# Patient Record
Sex: Female | Born: 1975 | Race: Black or African American | Hispanic: No | Marital: Single | Smoking: Former smoker
Health system: Southern US, Community
[De-identification: ages and names within clinical notes are randomized; demographics above are authoritative.]

## PROBLEM LIST (undated history)

## (undated) DIAGNOSIS — K631 Perforation of intestine (nontraumatic): Secondary | ICD-10-CM

## (undated) DIAGNOSIS — D250 Submucous leiomyoma of uterus: Secondary | ICD-10-CM

## (undated) DIAGNOSIS — N921 Excessive and frequent menstruation with irregular cycle: Secondary | ICD-10-CM

## (undated) DIAGNOSIS — E669 Obesity, unspecified: Secondary | ICD-10-CM

## (undated) DIAGNOSIS — N939 Abnormal uterine and vaginal bleeding, unspecified: Secondary | ICD-10-CM

## (undated) DIAGNOSIS — K5720 Diverticulitis of large intestine with perforation and abscess without bleeding: Secondary | ICD-10-CM

## (undated) DIAGNOSIS — D251 Intramural leiomyoma of uterus: Secondary | ICD-10-CM

## (undated) DIAGNOSIS — N971 Female infertility of tubal origin: Secondary | ICD-10-CM

## (undated) DIAGNOSIS — D649 Anemia, unspecified: Secondary | ICD-10-CM

## (undated) DIAGNOSIS — D252 Subserosal leiomyoma of uterus: Secondary | ICD-10-CM

## (undated) DIAGNOSIS — N978 Female infertility of other origin: Secondary | ICD-10-CM

## (undated) HISTORY — DX: Abnormal uterine and vaginal bleeding, unspecified: N93.9

## (undated) HISTORY — DX: Perforation of intestine (nontraumatic): K63.1

## (undated) HISTORY — DX: Submucous leiomyoma of uterus: D25.0

## (undated) HISTORY — DX: Subserosal leiomyoma of uterus: D25.2

## (undated) HISTORY — DX: Female infertility of tubal origin: N97.1

## (undated) HISTORY — DX: Female infertility of other origin: N97.8

## (undated) HISTORY — PX: UTERINE FIBROID SURGERY: SHX826

## (undated) HISTORY — DX: Intramural leiomyoma of uterus: D25.1

## (undated) HISTORY — DX: Diverticulitis of large intestine with perforation and abscess without bleeding: K57.20

## (undated) HISTORY — DX: Excessive and frequent menstruation with irregular cycle: N92.1

---

## 2012-02-10 ENCOUNTER — Emergency Department: Payer: Self-pay | Admitting: Emergency Medicine

## 2012-02-10 LAB — CBC
HCT: 34.6 % — ABNORMAL LOW (ref 35.0–47.0)
HGB: 11.5 g/dL — ABNORMAL LOW (ref 12.0–16.0)
MCH: 29.4 pg (ref 26.0–34.0)
MCV: 89 fL (ref 80–100)
RBC: 3.91 10*6/uL (ref 3.80–5.20)

## 2012-02-10 LAB — COMPREHENSIVE METABOLIC PANEL
Anion Gap: 5 — ABNORMAL LOW (ref 7–16)
BUN: 7 mg/dL (ref 7–18)
Bilirubin,Total: 0.3 mg/dL (ref 0.2–1.0)
Chloride: 105 mmol/L (ref 98–107)
Co2: 28 mmol/L (ref 21–32)
Creatinine: 0.8 mg/dL (ref 0.60–1.30)
EGFR (African American): >60
Potassium: 4 mmol/L (ref 3.5–5.1)
SGOT(AST): 13 U/L — ABNORMAL LOW (ref 15–37)
SGPT (ALT): 13 U/L (ref 12–78)
Sodium: 138 mmol/L (ref 136–145)
Total Protein: 9 g/dL — ABNORMAL HIGH (ref 6.4–8.2)

## 2012-02-10 LAB — LIPASE, BLOOD: Lipase: 90 U/L (ref 73–393)

## 2012-02-10 LAB — URINALYSIS, COMPLETE
Bilirubin,UR: NEGATIVE
Ketone: NEGATIVE
Ph: 5 (ref 4.5–8.0)
Protein: 30
Specific Gravity: 1.021 (ref 1.003–1.030)
Squamous Epithelial: 3
WBC UR: 370 /HPF (ref 0–5)

## 2012-02-10 LAB — WET PREP, GENITAL

## 2013-05-11 ENCOUNTER — Ambulatory Visit: Payer: Self-pay | Admitting: Obstetrics and Gynecology

## 2013-05-11 LAB — COMPREHENSIVE METABOLIC PANEL
Anion Gap: 7 (ref 7–16)
BUN: 10 mg/dL (ref 7–18)
Bilirubin,Total: 0.3 mg/dL (ref 0.2–1.0)
Calcium, Total: 8.7 mg/dL (ref 8.5–10.1)
Chloride: 107 mmol/L (ref 98–107)
Creatinine: 0.68 mg/dL (ref 0.60–1.30)
EGFR (African American): >60
Osmolality: 272 (ref 275–301)
Potassium: 4.1 mmol/L (ref 3.5–5.1)
SGOT(AST): 7 U/L — ABNORMAL LOW (ref 15–37)
SGPT (ALT): 14 U/L (ref 12–78)
Sodium: 137 mmol/L (ref 136–145)
Total Protein: 7.6 g/dL (ref 6.4–8.2)

## 2013-05-11 LAB — CBC
HCT: 21.7 % — ABNORMAL LOW (ref 35.0–47.0)
HGB: 6.9 g/dL — ABNORMAL LOW (ref 12.0–16.0)
MCV: 73 fL — ABNORMAL LOW (ref 80–100)
RDW: 19 % — ABNORMAL HIGH (ref 11.5–14.5)
WBC: 4.8 10*3/uL (ref 3.6–11.0)

## 2013-05-11 LAB — PREGNANCY, URINE: Pregnancy Test, Urine: NEGATIVE m[IU]/mL

## 2013-05-20 ENCOUNTER — Ambulatory Visit: Payer: Self-pay | Admitting: Obstetrics and Gynecology

## 2013-05-20 LAB — CBC
HCT: 22.3 % — ABNORMAL LOW (ref 35.0–47.0)
HGB: 6.8 g/dL — ABNORMAL LOW (ref 12.0–16.0)
MCH: 21.7 pg — ABNORMAL LOW (ref 26.0–34.0)
MCHC: 30.5 g/dL — ABNORMAL LOW (ref 32.0–36.0)
MCV: 71 fL — ABNORMAL LOW (ref 80–100)
Platelet: 304 10*3/uL (ref 150–440)

## 2013-05-23 LAB — PATHOLOGY REPORT

## 2014-04-11 ENCOUNTER — Encounter: Payer: Self-pay | Admitting: Obstetrics & Gynecology

## 2014-04-18 DIAGNOSIS — N921 Excessive and frequent menstruation with irregular cycle: Secondary | ICD-10-CM | POA: Insufficient documentation

## 2014-04-18 DIAGNOSIS — N971 Female infertility of tubal origin: Secondary | ICD-10-CM

## 2014-04-18 DIAGNOSIS — N978 Female infertility of other origin: Secondary | ICD-10-CM

## 2014-04-18 DIAGNOSIS — D250 Submucous leiomyoma of uterus: Secondary | ICD-10-CM

## 2014-04-18 HISTORY — DX: Excessive and frequent menstruation with irregular cycle: N92.1

## 2014-04-18 HISTORY — DX: Female infertility of tubal origin: N97.1

## 2014-04-18 HISTORY — DX: Female infertility of other origin: N97.8

## 2014-04-18 HISTORY — DX: Submucous leiomyoma of uterus: D25.0

## 2014-07-16 ENCOUNTER — Emergency Department: Payer: Self-pay | Admitting: Physician Assistant

## 2014-10-06 NOTE — Op Note (Signed)
PATIENT NAME:  Dana West, Dana West MR#:  [id number] DATE OF BIRTH:  [birth year]  DATE OF PROCEDURE:  05/20/2013  PREOPERATIVE DIAGNOSES: 1.  Abnormal uterine bleeding.  2.  Fibroid uterus (1 fibroid is submucosal, second fibroid is partially intramural and partially intracavitary).  3.  Chronic blood loss anemia.   POSTOPERATIVE DIAGNOSES: 1.  Abnormal uterine bleeding.  2.  Fibroid uterus (1 fibroid is submucosal, second fibroid is partially intramural and partially intracavitary).  3.  Chronic blood loss anemia.   PROCEDURES:  1.  Dilation and curettage.  2.  Hysteroscopy.  3.  Myomectomy using Mysure   ANESTHESIA:  General.  SURGEON:  Prentice Docker, M.D.   ESTIMATED BLOOD LOSS: 50 mL   OPERATIVE FLUIDS: 1000 mL crystalloid. Fluid deficit is 750 mL.   COMPLICATIONS: None.   FINDINGS: 1.  A 3 cm pedunculated fibroid emanating from left posterior lower uterine segment.  2.  Anterior fundal intramural and intracavitary fibroid impinging on the uterine cavity.   SPECIMENS: Fibroid fragments and endometrial curettings.   CONDITION: Stable at the end of the procedure.   PROCEDURE IN DETAIL: The patient was taken to the operating room where general anesthesia was administered and found to be adequate. She was placed in the dorsal supine high lithotomy position and prepped and draped in the usual sterile fashion. After timeout was called, her bladder was drained using a red rubber catheter. A sterile speculum was placed in the vagina and a single-tooth tenaculum was used to grasp the anterior lip of the cervix. The uterus was sounded to a depth of 11 cm. The hysteroscope was gently introduced through the cervix with the above-noted findings appreciated. The MyoSure device was used according to the manufacturer's recommendations to completely remove the pedunculated fibroid that was located at the lower uterine segment left posterior wall. The partially intracavitary fibroid was removed in as  much as it impinged on the cavity of the uterus. The procedure was terminated at this point and hemostasis was noted after lowering the pressure of the inflow to well below the MAP pressure of the patient. At this point the hysteroscope was removed.  The single-tooth tenaculum was removed from the cervix and silver nitrate was used to gain hemostasis. No instrumentation or sponges were remaining in the vagina to this point in the procedure.   The patient tolerated the procedure well. Sponge, lap, and needle counts were correct x 2. She was wearing pneumatic compression stockings that were on and operating throughout the entire procedure.   ____________________________ Will Bonnet, MD sdj:dp D: 05/20/2013 12:42:07 ET T: 05/20/2013 12:51:33 ET JOB#: [id number]  cc: Will Bonnet, MD, <Dictator> Will Bonnet MD ELECTRONICALLY SIGNED 05/26/2013 12:31

## 2015-01-11 DIAGNOSIS — D251 Intramural leiomyoma of uterus: Secondary | ICD-10-CM | POA: Insufficient documentation

## 2015-01-11 DIAGNOSIS — N971 Female infertility of tubal origin: Secondary | ICD-10-CM

## 2015-01-11 HISTORY — DX: Female infertility of tubal origin: N97.1

## 2015-01-11 HISTORY — DX: Intramural leiomyoma of uterus: D25.1

## 2015-01-12 DIAGNOSIS — N939 Abnormal uterine and vaginal bleeding, unspecified: Secondary | ICD-10-CM

## 2015-01-12 HISTORY — DX: Abnormal uterine and vaginal bleeding, unspecified: N93.9

## 2015-01-29 ENCOUNTER — Emergency Department
Admission: EM | Admit: 2015-01-29 | Discharge: 2015-01-29 | Disposition: A | Discharge status: Home / Self Care | Payer: BLUE CROSS/BLUE SHIELD | Attending: Emergency Medicine | Admitting: Emergency Medicine

## 2015-01-29 ENCOUNTER — Encounter: Payer: Self-pay | Admitting: Emergency Medicine

## 2015-01-29 DIAGNOSIS — H9202 Otalgia, left ear: Secondary | ICD-10-CM | POA: Diagnosis present

## 2015-01-29 DIAGNOSIS — Z720 Tobacco use: Secondary | ICD-10-CM | POA: Diagnosis not present

## 2015-01-29 DIAGNOSIS — H65192 Other acute nonsuppurative otitis media, left ear: Secondary | ICD-10-CM | POA: Diagnosis not present

## 2015-01-29 MED ORDER — AMOXICILLIN 500 MG PO CAPS: 500.0000 mg | ORAL_CAPSULE | Freq: Three times a day (TID) | ORAL | Status: DC

## 2015-01-29 MED ORDER — FEXOFENADINE-PSEUDOEPHED ER 60-120 MG PO TB12: 1.0000 | ORAL_TABLET | Freq: Two times a day (BID) | ORAL | Status: DC

## 2015-01-29 MED ORDER — TRAMADOL HCL 50 MG PO TABS: 50.0000 mg | ORAL_TABLET | Freq: Four times a day (QID) | ORAL | Status: AC | PRN

## 2015-01-29 NOTE — ED Provider Notes (Signed)
[facility] Emergency Department Provider Note  ____________________________________________  Time seen: Approximately 4:16 PM  I have reviewed the triage vital signs and the nursing notes.   HISTORY  Chief Complaint Otalgia    HPI Dana West is a 39 y.o. female patient complaining of left ear pain for 6 days. Patient stated the pain has gotten increasingly worse she is now having fever and some nausea. Patient states the ear is throbbing. Patient relieve by taking over-the-counter anti-inflammatory medications. Patient  also used sweet oil and another over-the-counter ear drops.Patient is rating her pain as a 10 over 10.  History reviewed. No pertinent past medical history.  There are no active problems to display for this patient.   Past Surgical History  Procedure Laterality Date  . Uterine fibroid surgery      No current outpatient prescriptions on file.  Allergies Review of patient's allergies indicates no known allergies.  No family history on file.  Social History Social History  Substance Use Topics  . Smoking status: Current Every Day Smoker    Types: Cigarettes  . Smokeless tobacco: None  . Alcohol Use: No    Review of Systems Constitutional: No fever/chills Eyes: No visual changes. ENT: No sore throat. Left ear pain Cardiovascular: Denies chest pain. Respiratory: Denies shortness of breath. Gastrointestinal: No abdominal pain.  No nausea, no vomiting.  No diarrhea.  No constipation. Genitourinary: Negative for dysuria. Musculoskeletal: Negative for back pain. Skin: Negative for rash. Neurological: Negative for headaches, focal weakness or numbness. 10-point ROS otherwise negative.  ____________________________________________   PHYSICAL EXAM:  VITAL SIGNS: ED Triage Vitals  Enc Vitals Group     BP 01/29/15 1514 145/72 mmHg     Pulse Rate 01/29/15 1514 85     Resp 01/29/15 1514 19     Temp 01/29/15 1514 99.1  F (37.3 C)     Temp Source 01/29/15 1514 Oral     SpO2 01/29/15 1514 97 %     Weight 01/29/15 1514 210 lb (95.255 kg)     Height 01/29/15 1514 5\' 8"  (1.727 m)     Head Cir --      Peak Flow --      Pain Score 01/29/15 1514 10     Pain Loc --      Pain Edu? --      Excl. in [place]? --    Constitutional: Alert and oriented. Well appearing and in no acute distress. Eyes: Conjunctivae are normal. PERRL. EOMI. Head: Atraumatic. Nose: No congestion/rhinnorhea. ENT: Edematous left TM is also erythematous. Nol adenopathy Mouth/Throat: Mucous membranes are moist.  Oropharynx non-erythematous. Neck: No stridor.  No cervical spine tenderness to palpation. Hematological/Lymphatic/Immunilogical: No cervical lymphadenopathy. Cardiovascular: Normal rate, regular rhythm. Grossly normal heart sounds.  Good peripheral circulation. Respiratory: Normal respiratory effort.  No retractions. Lungs CTAB. Gastrointestinal: Soft and nontender. No distention. No abdominal bruits. No CVA tenderness. Musculoskeletal: No lower extremity tenderness nor edema.  No joint effusions. Neurologic:  Normal speech and language. No gross focal neurologic deficits are appreciated. No gait instability. Skin:  Skin is warm, dry and intact. No rash noted. Psychiatric: Mood and affect are normal. Speech and behavior are normal.  ____________________________________________   LABS (all labs ordered are listed, but only abnormal results are displayed)  Labs Reviewed - No data to display ____________________________________________  EKG  ____________________________________________  RADIOLOGY   ____________________________________________   PROCEDURES  Procedure(s) performed: None  Critical Care performed: No  ____________________________________________   INITIAL IMPRESSION /  ASSESSMENT AND PLAN / ED COURSE  Pertinent labs & imaging results that were available during my care of the patient were reviewed by  me and considered in my medical decision making (see chart for details).  Left otitis media. Patient given prescription for amoxicillin, Allegra-D, and tramadol. Patient advised follow ENT clinic if no improvement or worsening of her complaint in 3 days. ____________________________________________   FINAL CLINICAL IMPRESSION(S) / ED DIAGNOSES  Final diagnoses:  Acute nonsuppurative otitis media of left ear      Sable Feil, PA-C 01/29/15 [place], PA-C 01/29/15 1625  Lavonia Drafts, MD 01/30/15 [phone]

## 2015-01-29 NOTE — ED Notes (Addendum)
Pt reports left earache x6 days, reports fever and vomiting x1 last night from the pain. Pt reports ear is throbbing. Pt reports taking 3 aleve this morning.

## 2015-01-29 NOTE — Discharge Instructions (Signed)

## 2015-01-29 NOTE — ED Notes (Signed)
Assessment per PA 

## 2015-06-17 HISTORY — PX: ABDOMINAL HYSTERECTOMY: SHX81

## 2015-08-26 ENCOUNTER — Emergency Department
Admission: EM | Admit: 2015-08-26 | Discharge: 2015-08-26 | Disposition: A | Discharge status: Home / Self Care | Payer: 59 | Attending: Emergency Medicine | Admitting: Emergency Medicine

## 2015-08-26 ENCOUNTER — Encounter: Payer: Self-pay | Admitting: Emergency Medicine

## 2015-08-26 DIAGNOSIS — M25512 Pain in left shoulder: Secondary | ICD-10-CM | POA: Diagnosis present

## 2015-08-26 DIAGNOSIS — M79622 Pain in left upper arm: Secondary | ICD-10-CM | POA: Insufficient documentation

## 2015-08-26 DIAGNOSIS — Z87891 Personal history of nicotine dependence: Secondary | ICD-10-CM | POA: Diagnosis not present

## 2015-08-26 DIAGNOSIS — Z792 Long term (current) use of antibiotics: Secondary | ICD-10-CM | POA: Insufficient documentation

## 2015-08-26 DIAGNOSIS — Z79899 Other long term (current) drug therapy: Secondary | ICD-10-CM | POA: Diagnosis not present

## 2015-08-26 DIAGNOSIS — M542 Cervicalgia: Secondary | ICD-10-CM | POA: Insufficient documentation

## 2015-08-26 MED ORDER — CYCLOBENZAPRINE HCL 10 MG PO TABS
5.0000 mg | ORAL_TABLET | Freq: Once | ORAL | Status: AC
  Administered 2015-08-26: 5 mg via ORAL
  Filled 2015-08-26: qty 1

## 2015-08-26 MED ORDER — CYCLOBENZAPRINE HCL 5 MG PO TABS: 5.0000 mg | ORAL_TABLET | Freq: Three times a day (TID) | ORAL | Status: DC | PRN

## 2015-08-26 NOTE — ED Notes (Signed)
Pt c/o pain in her neck radiating across bilateral shoulders and down both arms since Friday; pt says she does repetitive work at her job at [employer] and thinks this could be the cause; denies injury; pt able to replicate motion in triage without difficulty

## 2015-08-26 NOTE — ED Provider Notes (Signed)
[facility] Emergency Department Provider Note    ____________________________________________  Time seen: ~0335  I have reviewed the triage vital signs and the nursing notes.   HISTORY  Chief Complaint Neck Pain; Arm Pain; and Shoulder Pain   History limited by: Not Limited   HPI Dana West is a 40 y.o. female who presents to the emergency department todaybecause of concerns for musculoskeletal in. Patient states that she started having left shoulder, neck and arm pain. This started 2 days ago. She describes it is worse when she moves her arm over her head. It will become severe. She is additionally now having pain in the right upper extremity. Patient states that she thinks this might be due to repetitive motions at her job. She denies any trauma or injury to that extremity. Denies any fevers, nausea or vomiting.    History reviewed. No pertinent past medical history.  There are no active problems to display for this patient.   Past Surgical History  Procedure Laterality Date  . Uterine fibroid surgery    . Abdominal hysterectomy      Current Outpatient Rx  Name  Route  Sig  Dispense  Refill  . ibuprofen (ADVIL,MOTRIN) 600 MG tablet   Oral   Take 600 mg by mouth every 6 (six) hours as needed.         Marland Kitchen amoxicillin (AMOXIL) 500 MG capsule   Oral   Take 1 capsule (500 mg total) by mouth 3 (three) times daily.   30 capsule   0   . fexofenadine-pseudoephedrine (ALLEGRA-D) 60-120 MG per tablet   Oral   Take 1 tablet by mouth 2 (two) times daily.   30 tablet   0   . traMADol (ULTRAM) 50 MG tablet   Oral   Take 1 tablet (50 mg total) by mouth every 6 (six) hours as needed.   20 tablet   0     Allergies Valium  History reviewed. No pertinent family history.  Social History Social History  Substance Use Topics  . Smoking status: Former Research scientist (life sciences)  . Smokeless tobacco: None  . Alcohol Use: Yes     Comment: seldom     Review of Systems  Constitutional: Negative for fever. Cardiovascular: Negative for chest pain. Respiratory: Negative for shortness of breath. Gastrointestinal: Negative for abdominal pain, vomiting and diarrhea. Neurological: Negative for headaches, focal weakness or numbness.  10-point ROS otherwise negative.  ____________________________________________   PHYSICAL EXAM:  VITAL SIGNS: ED Triage Vitals  Enc Vitals Group     BP 08/26/15 0309 139/84 mmHg     Pulse Rate 08/26/15 0309 91     Resp 08/26/15 0309 18     Temp 08/26/15 0309 99.6 F (37.6 C)     Temp Source 08/26/15 0309 Oral     SpO2 08/26/15 0309 100 %     Weight 08/26/15 0309 225 lb (102.059 kg)     Height 08/26/15 0309 5\' 7"  (1.702 m)     Head Cir --      Peak Flow --      Pain Score 08/26/15 0310 10   Constitutional: Alert and oriented. Appears slightly anxious. Eyes: Conjunctivae are normal. PERRL. Normal extraocular movements. ENT   Head: Normocephalic and atraumatic.   Nose: No congestion/rhinnorhea.   Mouth/Throat: Mucous membranes are moist.   Neck: No stridor. Hematological/Lymphatic/Immunilogical: No cervical lymphadenopathy. Cardiovascular: Normal rate, regular rhythm.  No murmurs, rubs, or gallops. Respiratory: Normal respiratory effort without tachypnea nor retractions.  Breath sounds are clear and equal bilaterally. No wheezes/rales/rhonchi. Gastrointestinal: Soft and nontender. No distention.  Genitourinary: Deferred Musculoskeletal: Patient with some discomfort with active and passive range of motion of the left shoulder. Slightly tender over palpation of the trapezius deltoid and upper bicep. No obvious deformities. Neurovascularly intact distally. No erythema or warmth at the joint. Neurologic:  Normal speech and language. No gross focal neurologic deficits are appreciated.  Skin:  Skin is warm, dry and intact. No rash noted. Psychiatric: Mood and affect are normal. Speech and  behavior are normal. Patient exhibits appropriate insight and judgment.  ____________________________________________    LABS (pertinent positives/negatives)  None  ____________________________________________   EKG  None  ____________________________________________    RADIOLOGY  None  ____________________________________________   PROCEDURES  Procedure(s) performed: None  Critical Care performed: No  ____________________________________________   INITIAL IMPRESSION / ASSESSMENT AND PLAN / ED COURSE  Pertinent labs & imaging results that were available during my care of the patient were reviewed by me and considered in my medical decision making (see chart for details).  Patient presented to the emergency department today with concerns for upper extremity pain. On exam the pain is reproducible. Patient without any concerning signs for septic joint or deformity. Think likely patient suffering pain secondary to overuse. Will give muscle relaxant.   ____________________________________________   FINAL CLINICAL IMPRESSION(S) / ED DIAGNOSES  Final diagnoses:  Left shoulder pain     Nance Pear, MD 08/26/15 [phone]

## 2015-08-26 NOTE — Discharge Instructions (Signed)
Please seek medical attention for any high fevers, chest pain, shortness of breath, change in behavior, persistent vomiting, bloody stool or any other new or concerning symptoms. ° ° °Musculoskeletal Pain °Musculoskeletal pain is muscle and boney aches and pains. These pains can occur in any part of the body. Your caregiver may treat you without knowing the cause of the pain. They may treat you if blood or urine tests, X-rays, and other tests were normal.  °CAUSES °There is often not a definite cause or reason for these pains. These pains may be caused by a type of germ (virus). The discomfort may also come from overuse. Overuse includes working out too hard when your body is not fit. Boney aches also come from weather changes. Bone is sensitive to atmospheric pressure changes. °HOME CARE INSTRUCTIONS  °· Ask when your test results will be ready. Make sure you get your test results. °· Only take over-the-counter or prescription medicines for pain, discomfort, or fever as directed by your caregiver. If you were given medications for your condition, do not drive, operate machinery or power tools, or sign legal documents for 24 hours. Do not drink alcohol. Do not take sleeping pills or other medications that may interfere with treatment. °· Continue all activities unless the activities cause more pain. When the pain lessens, slowly resume normal activities. Gradually increase the intensity and duration of the activities or exercise. °· During periods of severe pain, bed rest may be helpful. Lay or sit in any position that is comfortable. °· Putting ice on the injured area. °¨ Put ice in a bag. °¨ Place a towel between your skin and the bag. °¨ Leave the ice on for 15 to 20 minutes, 3 to 4 times a day. °· Follow up with your caregiver for continued problems and no reason can be found for the pain. If the pain becomes worse or does not go away, it may be necessary to repeat tests or do additional testing. Your caregiver  may need to look further for a possible cause. °SEEK IMMEDIATE MEDICAL CARE IF: °· You have pain that is getting worse and is not relieved by medications. °· You develop chest pain that is associated with shortness or breath, sweating, feeling sick to your stomach (nauseous), or throw up (vomit). °· Your pain becomes localized to the abdomen. °· You develop any new symptoms that seem different or that concern you. °MAKE SURE YOU:  °· Understand these instructions. °· Will watch your condition. °· Will get help right away if you are not doing well or get worse. °  °This information is not intended to replace advice given to you by your health care provider. Make sure you discuss any questions you have with your health care provider. °  °Document Released: 06/02/2005 Document Revised: 08/25/2011 Document Reviewed: 02/04/2013 °Elsevier Interactive Patient Education ©2016 [employer] ° °

## 2015-11-08 ENCOUNTER — Emergency Department
Admission: EM | Admit: 2015-11-08 | Discharge: 2015-11-08 | Disposition: A | Discharge status: Home / Self Care | Payer: 59 | Attending: Emergency Medicine | Admitting: Emergency Medicine

## 2015-11-08 ENCOUNTER — Encounter: Payer: Self-pay | Admitting: Emergency Medicine

## 2015-11-08 DIAGNOSIS — F17210 Nicotine dependence, cigarettes, uncomplicated: Secondary | ICD-10-CM | POA: Diagnosis not present

## 2015-11-08 DIAGNOSIS — K120 Recurrent oral aphthae: Secondary | ICD-10-CM | POA: Diagnosis not present

## 2015-11-08 DIAGNOSIS — K1379 Other lesions of oral mucosa: Secondary | ICD-10-CM | POA: Diagnosis present

## 2015-11-08 DIAGNOSIS — Z79899 Other long term (current) drug therapy: Secondary | ICD-10-CM | POA: Diagnosis not present

## 2015-11-08 DIAGNOSIS — Z792 Long term (current) use of antibiotics: Secondary | ICD-10-CM | POA: Diagnosis not present

## 2015-11-08 DIAGNOSIS — Z791 Long term (current) use of non-steroidal anti-inflammatories (NSAID): Secondary | ICD-10-CM | POA: Diagnosis not present

## 2015-11-08 MED ORDER — MAGIC MOUTHWASH W/LIDOCAINE: 5.0000 mL | Freq: Four times a day (QID) | ORAL | Status: DC

## 2015-11-08 NOTE — ED Notes (Signed)
Pt with multiple white painful areas roof of mouth.

## 2015-11-08 NOTE — ED Notes (Signed)
Pt with blisters/ulcers on roof of mouth and on the back of her tongue that are painful and itchy that appeared 1.5 weeks ago.  Sores are not improving.  No swelling of lips, tongue or face.  Pt in no distress at this time.

## 2015-11-08 NOTE — ED Provider Notes (Signed)
[facility] Emergency Department Provider Note  ____________________________________________  Time seen: Approximately 3:23 PM  I have reviewed the triage vital signs and the nursing notes.   HISTORY  Chief Complaint Mouth Lesions    HPI Dana West is a 40 y.o. female who presents to emergency department complaining of "ulcers" to the roof of her mouth 7-10 days. Patient states that they all presented at the same time and she indicates there are 3 lesions. Patient states they're both itchy as well as a burning sensation. She states that they have not changed in size or location. She denies any swelling of tongue or lips. Patient denies any difficulty swallowing or breathing. Patient denies any fevers or chills, sore throat, chest pain, shortness of breath, nausea or vomiting. Patient denies any other medical history. Patient denies history of same in the past. She denies any medication use at this time.   History reviewed. No pertinent past medical history.  There are no active problems to display for this patient.   Past Surgical History  Procedure Laterality Date  . Uterine fibroid surgery    . Abdominal hysterectomy      Current Outpatient Rx  Name  Route  Sig  Dispense  Refill  . amoxicillin (AMOXIL) 500 MG capsule   Oral   Take 1 capsule (500 mg total) by mouth 3 (three) times daily.   30 capsule   0   . cyclobenzaprine (FLEXERIL) 5 MG tablet   Oral   Take 1 tablet (5 mg total) by mouth every 8 (eight) hours as needed for muscle spasms.   20 tablet   0   . fexofenadine-pseudoephedrine (ALLEGRA-D) 60-120 MG per tablet   Oral   Take 1 tablet by mouth 2 (two) times daily.   30 tablet   0   . ibuprofen (ADVIL,MOTRIN) 600 MG tablet   Oral   Take 600 mg by mouth every 6 (six) hours as needed.         . magic mouthwash w/lidocaine SOLN   Oral   Take 5 mLs by mouth 4 (four) times daily.   240 mL   0     Dispense in a 1/1/1/1  ratio. Use lidocaine, diphen ...   . traMADol (ULTRAM) 50 MG tablet   Oral   Take 1 tablet (50 mg total) by mouth every 6 (six) hours as needed.   20 tablet   0     Allergies Raspberry and Valium  No family history on file.  Social History Social History  Substance Use Topics  . Smoking status: Current Some Day Smoker  . Smokeless tobacco: None  . Alcohol Use: Yes     Comment: seldom     Review of Systems  Constitutional: No fever/chills ENT: Patient reports mouth ulcerations to the roof of the mouth. Cardiovascular: no chest pain. Respiratory: no cough. No SOB. Gastrointestinal: No abdominal pain.  No nausea, no vomiting.   Musculoskeletal: Negative for musculoskeletal pain. Skin: Negative for rash, abrasions, lacerations, ecchymosis. Neurological: Negative for headaches, focal weakness or numbness. 10-point ROS otherwise negative.  ____________________________________________   PHYSICAL EXAM:  VITAL SIGNS: ED Triage Vitals  Enc Vitals Group     BP 11/08/15 1304 129/70 mmHg     Pulse Rate 11/08/15 1304 82     Resp 11/08/15 1304 16     Temp 11/08/15 1304 98.1 F (36.7 C)     Temp Source 11/08/15 1304 Oral     SpO2 11/08/15 1304  100 %     Weight 11/08/15 1304 200 lb (90.719 kg)     Height 11/08/15 1304 5\' 6"  (1.676 m)     Head Cir --      Peak Flow --      Pain Score 11/08/15 1311 8     Pain Loc --      Pain Edu? --      Excl. in [place]? --      Constitutional: Alert and oriented. Well appearing and in no acute distress. Eyes: Conjunctivae are normal. PERRL. EOMI. Head: Atraumatic. ENT:      Ears:       Nose: No congestion/rhinnorhea.      Mouth/Throat: Mucous membranes are moist. 5 well demarcated ulcerations are noted to the hard palate of the mouth. These are less than 2 mm in diameter. Patient does have poor dentition but there are no signs of erythema or edema suggesting abscess. No loose or missing dentition. Ulcerations are not visualized and the  pharyngeal region. Uvula is unremarkable and midline. Neck: No stridor.   Hematological/Lymphatic/Immunilogical: No cervical lymphadenopathy. Cardiovascular: Normal rate, regular rhythm. Normal S1 and S2.  Good peripheral circulation. Respiratory: Normal respiratory effort without tachypnea or retractions. Lungs CTAB. Good air entry to the bases with no decreased or absent breath sounds Musculoskeletal: Full range of motion to all extremities. No gross deformities appreciated. Neurologic:  Normal speech and language. No gross focal neurologic deficits are appreciated.  Skin:  Skin is warm, dry and intact. No rash noted. Psychiatric: Mood and affect are normal. Speech and behavior are normal. Patient exhibits appropriate insight and judgement.   ____________________________________________   LABS (all labs ordered are listed, but only abnormal results are displayed)  Labs Reviewed - No data to display ____________________________________________  EKG   ____________________________________________  RADIOLOGY   No results found.  ____________________________________________    PROCEDURES  Procedure(s) performed:       Medications - No data to display   ____________________________________________   INITIAL IMPRESSION / ASSESSMENT AND PLAN / ED COURSE  Pertinent labs & imaging results that were available during my care of the patient were reviewed by me and considered in my medical decision making (see chart for details).  Patient's diagnosis is consistent with aphthous stomatitis. Exam is reassuring with no indication of airway obstruction. No signs of systemic infection. Patient has 5 visible ulcerations to the hard palate consistent with aphthous stomatitis.. Patient will be discharged home with prescriptions for Magic mouthwash for symptom management. Patient is to follow up with dentist as needed or otherwise directed. Patient is given ED precautions to return to  the ED for any worsening or new symptoms.     ____________________________________________  FINAL CLINICAL IMPRESSION(S) / ED DIAGNOSES  Final diagnoses:  Aphthous stomatitis      NEW MEDICATIONS STARTED DURING THIS VISIT:  New Prescriptions   MAGIC MOUTHWASH W/LIDOCAINE SOLN    Take 5 mLs by mouth 4 (four) times daily.        This chart was dictated using voice recognition software/Dragon. Despite best efforts to proofread, errors can occur which can change the meaning. Any change was purely unintentional.    Darletta Moll, PA-C 11/08/15 1554

## 2015-11-08 NOTE — Discharge Instructions (Signed)
Oral Ulcers °Oral ulcers are painful, shallow sores around the lining of the mouth. They can affect the gums, the inside of the lips, and the cheeks. (Sores on the outside of the lips and on the face are different.) They typically first occur in school-aged children and teenagers. Oral ulcers may also be called canker sores or cold sores. °CAUSES  °Canker sores and cold sores can be caused by many factors including: °· Infection. °· Injury. °· Sun exposure. °· Medications. °· Emotional stress. °· Food allergies. °· Vitamin deficiencies. °· Toothpastes containing sodium lauryl sulfate. °The herpes virus can be the cause of mouth ulcers. The first infection can be severe and cause 10 or more ulcers on the gums, tongue, and lips with fever and difficulty in swallowing. This infection usually occurs between the ages of 1 and 3 years.  °SYMPTOMS  °The typical sore is about ¼ inch (6 mm) in size and is an oval or round ulcer with red borders. °DIAGNOSIS  °Your caregiver can diagnose simple oral ulcers by examination. Additional testing is usually not required.  °TREATMENT  °Treatment is aimed at pain relief. Generally, oral ulcers resolve by themselves within 1 to 2 weeks without medication and are not contagious unless caused by herpes (and other viruses). Antibiotics are not effective with mouth sores. Avoid direct contact with others until the ulcer is completely healed. See your caregiver for follow-up care as recommended. Also: °· Offer a soft diet. °· Encourage plenty of fluids to prevent dehydration. Popsicles and milk shakes can be helpful. °· Avoid acidic and salty foods and drinks such as orange juice. °· Infants and young children will often refuse to drink because of pain. Using a teaspoon, cup, or syringe to give small amounts of fluids frequently can help prevent dehydration. °· Cold compresses on the face may help reduce pain. °· Pain medication can help control soreness. °· A solution of diphenhydramine  mixed with a liquid antacid can be useful to decrease the soreness of ulcers. Consult a caregiver for the dosing. °· Liquids or ointments with a numbing ingredient may be helpful when used as recommended. °· Older children and teenagers can rinse their mouth with a salt-water mixture (1/2 teaspoon of salt in 8 ounces of water) four times a day. This treatment is uncomfortable but may reduce the time the ulcers are present. °· There are many over-the-counter throat lozenges and medications available for oral ulcers. Their effectiveness has not been studied. °· Consult your medical caregiver prior to using homeopathic treatments for oral ulcers. °SEEK MEDICAL CARE IF:  °· You think your child needs to be seen. °· The pain worsens and you cannot control it. °· There are 4 or more ulcers. °· The lips and gums begin to bleed and crust. °· A single mouth ulcer is near a tooth that is causing a toothache or pain. °· Your child has a fever, swollen face, or swollen glands. °· The ulcers began after starting a medication. °· Mouth ulcers keep reoccurring or last more than 2 weeks. °· You think your child is not taking adequate fluids. °SEEK IMMEDIATE MEDICAL CARE IF:  °· Your child has a high fever. °· Your child is unable to swallow or becomes dehydrated. °· Your child looks or acts very ill. °· An ulcer caused by a chemical your child accidentally put in their mouth. °  °This information is not intended to replace advice given to you by your health care provider. Make sure you discuss any   questions you have with your health care provider.   Document Released: 07/10/2004 Document Revised: 06/23/2014 Document Reviewed: 10/18/2014 Elsevier Interactive Patient Education 2016 [place] and Dentist Visits Dental care supports good overall health. Regular dental visits can also help you avoid dental pain, bleeding, infection, and other more serious health problems in the future. It is important to keep the  mouth healthy because diseases in the teeth, gums, and other oral tissues can spread to other areas of the body. Some problems, such as diabetes, heart disease, and pre-term labor have been associated with poor oral health.  See your dentist every 6 months. If you experience emergency problems such as a toothache or broken tooth, go to the dentist right away. If you see your dentist regularly, you may catch problems early. It is easier to be treated for problems in the early stages.  WHAT TO EXPECT AT A DENTIST VISIT  Your dentist will look for many common oral health problems and recommend proper treatment. At your regular dental visit, you can expect:  Gentle cleaning of the teeth and gums. This includes scraping and polishing. This helps to remove the sticky substance around the teeth and gums (plaque). Plaque forms in the mouth shortly after eating. Over time, plaque hardens on the teeth as tartar. If tartar is not removed regularly, it can cause problems. Cleaning also helps remove stains.  Periodic X-rays. These pictures of the teeth and supporting bone will help your dentist assess the health of your teeth.  Periodic fluoride treatments. Fluoride is a natural mineral shown to help strengthen teeth. Fluoride treatmentinvolves applying a fluoride gel or varnish to the teeth. It is most commonly done in children.  Examination of the mouth, tongue, jaws, teeth, and gums to look for any oral health problems, such as:  Cavities (dental caries). This is decay on the tooth caused by plaque, sugar, and acid in the mouth. It is best to catch a cavity when it is small.  Inflammation of the gums caused by plaque buildup (gingivitis).  Problems with the mouth or malformed or misaligned teeth.  Oral cancer or other diseases of the soft tissues or jaws. KEEP YOUR TEETH AND GUMS HEALTHY For healthy teeth and gums, follow these general guidelines as well as your dentist's specific advice:  Have your  teeth professionally cleaned at the dentist every 6 months.  Brush twice daily with a fluoride toothpaste.  Floss your teeth daily.  Ask your dentist if you need fluoride supplements, treatments, or fluoride toothpaste.  Eat a healthy diet. Reduce foods and drinks with added sugar.  Avoid smoking. TREATMENT FOR ORAL HEALTH PROBLEMS If you have oral health problems, treatment varies depending on the conditions present in your teeth and gums.  Your caregiver will most likely recommend good oral hygiene at each visit.  For cavities, gingivitis, or other oral health disease, your caregiver will perform a procedure to treat the problem. This is typically done at a separate appointment. Sometimes your caregiver will refer you to another dental specialist for specific tooth problems or for surgery. SEEK IMMEDIATE DENTAL CARE IF:  You have pain, bleeding, or soreness in the gum, tooth, jaw, or mouth area.  A permanent tooth becomes loose or separated from the gum socket.  You experience a blow or injury to the mouth or jaw area.   This information is not intended to replace advice given to you by your health care provider. Make sure you discuss any questions you  have with your health care provider.   Document Released: 02/12/2011 Document Revised: 08/25/2011 Document Reviewed: 02/12/2011 Elsevier Interactive Patient Education 2016 [place] [facility]  [place] [facility] and [place] [email or web address] [phone])  [facility] [phone])  [place] [phone])  [employer] [phone])  [facility] [phone]) This clinic caters to the indigent population and is on a lottery system. Location: [employer] [facility], [place], [place], [place] [facility] Hours: Wednesdays from 6pm - 9pm, patients seen by a lottery system. For dates, call or go to [email or web address] Services: Cleanings, fillings and simple extractions. Payment Options: DENTAL WORK IS FREE OF CHARGE. Bring proof of income or support. Best way to get seen: Arrive at 5:15 pm - this is a lottery, NOT first come/first serve, so arriving earlier will not increase your chances of being seen.     [facility] [phone] Select option 1 for emergencies   Location: [facility], [place], [address], [place] Clinic Hours: No walk-ins accepted - call the day before to schedule an appointment. Check in times are 9:30 am and 1:30 pm. Services: Simple extractions, temporary fillings, pulpectomy/pulp debridement, uncomplicated abscess drainage. Payment Options: PAYMENT IS DUE AT THE TIME OF SERVICE.  Fee is usually $100-200, additional surgical procedures (e.g. abscess drainage) may be extra. Cash, checks, Visa/MasterCard accepted.  Can file Medicaid if patient is covered for dental - patient should call case worker to check. No discount for [facility] patients. Best way to get seen: MUST call the day before and get onto the schedule. Can usually be seen the next 1-2 days. No walk-ins accepted.     [place] [phone]   Location: [place], [place] [facility] Hours: M, W, Th, F 8am or 1:30pm, Tues 9a or 1:30 - first come/first served. Services: Simple extractions, temporary fillings, uncomplicated abscess drainage.  You do not need to be an [facility] resident. Payment Options: PAYMENT IS DUE AT THE TIME OF SERVICE. Dental insurance, otherwise sliding scale - bring proof of income or support. Depending on income and treatment needed, cost is usually $50-200. Best way to get seen: Arrive early as it is first come/first  served.     [facility] [phone]   Location: [place] [facility] Hours: Mon-Thu 8a-5p Services: Most basic dental services including extractions and fillings. Payment Options: PAYMENT IS DUE AT THE TIME OF SERVICE. Sliding scale, up to 50% off - bring proof if income or support. Medicaid with dental option accepted. Best way to get seen: Call to schedule an appointment, can usually be seen within 2 weeks OR they will try to see walk-ins - show up at [place] or 2p (you may have to wait).     [facility] [place] RESIDENTS ONLY   Location: [facility], [place] [address] [place], [place] [address] Clinic Hours: By appointment only. Monday - Thursday 8am-5pm, Friday 8am-12pm Services: Cleanings, fillings, extractions. Payment Options: PAYMENT IS DUE AT THE TIME OF SERVICE. Cash, Visa or MasterCard. Sliding scale - $30 minimum per service. Best way to get seen: Come in to office, complete packet and make an appointment - need proof of income or support monies for each household member and proof  of [facility] residence. Usually takes about a month to get in.     [facility] [phone]   Location: [address], [place] Clinic Hours: Walk-in Urgent Care Dental Services are offered Monday-Friday mornings only. The numbers of emergencies accepted daily is limited to the number of providers available. Maximum 15 - Mondays, Wednesdays & Thursdays Maximum 10 - Tuesdays & Fridays Services: You do not need to be a [facility] resident to be seen for a dental emergency. Emergencies are defined as pain, swelling, abnormal bleeding, or dental trauma. Walkins will receive x-rays if needed. NOTE: Dental cleaning is not an emergency. Payment Options: PAYMENT IS DUE AT THE TIME OF SERVICE. Minimum co-pay is $40.00 for uninsured patients. Minimum  co-pay is $3.00 for Medicaid with dental coverage. Dental Insurance is accepted and must be presented at time of visit. Medicare does not cover dental. Forms of payment: Cash, credit card, checks. Best way to get seen: If not previously registered with the clinic, walk-in dental registration begins at 7:15 am and is on a first come/first serve basis. If previously registered with the clinic, call to make an appointment.     [facility] [place] ONLY   Location: [address], [place], [place] Clinic Hours: Mon-Thu 10a-2p Services: Extractions only! Payment Options: FREE (donations accepted) - bring proof of income or support Best way to get seen: Call and schedule an appointment OR come at 8am on the 1st Monday of every month (except for holidays) when it is first come/first served.     [facility] [phone]   Location: [place], [place] [facility] Hours: Friday mornings Services, Payment Options, Best way to get seen: Call for info

## 2017-02-23 ENCOUNTER — Emergency Department: Payer: 59

## 2017-02-23 ENCOUNTER — Emergency Department
Admission: EM | Admit: 2017-02-23 | Discharge: 2017-02-23 | Disposition: A | Discharge status: Home / Self Care | Payer: 59 | Attending: Emergency Medicine | Admitting: Emergency Medicine

## 2017-02-23 DIAGNOSIS — K5792 Diverticulitis of intestine, part unspecified, without perforation or abscess without bleeding: Secondary | ICD-10-CM

## 2017-02-23 DIAGNOSIS — Z79899 Other long term (current) drug therapy: Secondary | ICD-10-CM | POA: Insufficient documentation

## 2017-02-23 DIAGNOSIS — K5732 Diverticulitis of large intestine without perforation or abscess without bleeding: Secondary | ICD-10-CM | POA: Insufficient documentation

## 2017-02-23 DIAGNOSIS — F17210 Nicotine dependence, cigarettes, uncomplicated: Secondary | ICD-10-CM | POA: Diagnosis not present

## 2017-02-23 DIAGNOSIS — R1030 Lower abdominal pain, unspecified: Secondary | ICD-10-CM | POA: Diagnosis present

## 2017-02-23 LAB — COMPREHENSIVE METABOLIC PANEL
ALK PHOS: 81 U/L (ref 38–126)
ALT: 19 U/L (ref 14–54)
AST: 20 U/L (ref 15–41)
Albumin: 3.5 g/dL (ref 3.5–5.0)
Anion gap: 8 (ref 5–15)
BUN: 10 mg/dL (ref 6–20)
CALCIUM: 9.2 mg/dL (ref 8.9–10.3)
CHLORIDE: 101 mmol/L (ref 101–111)
CO2: 25 mmol/L (ref 22–32)
CREATININE: 0.79 mg/dL (ref 0.44–1.00)
GFR calc Af Amer: >60 mL/min (ref >60)
Glucose, Bld: 117 mg/dL — ABNORMAL HIGH (ref 65–99)
Potassium: 4 mmol/L (ref 3.5–5.1)
SODIUM: 134 mmol/L — AB (ref 135–145)
Total Bilirubin: 0.6 mg/dL (ref 0.3–1.2)
Total Protein: 8.4 g/dL — ABNORMAL HIGH (ref 6.5–8.1)

## 2017-02-23 LAB — URINALYSIS, COMPLETE (UACMP) WITH MICROSCOPIC
Bacteria, UA: NONE SEEN
Bilirubin Urine: NEGATIVE
Glucose, UA: NEGATIVE mg/dL
Ketones, ur: NEGATIVE mg/dL
Leukocytes, UA: NEGATIVE
Nitrite: NEGATIVE
PH: 5 (ref 5.0–8.0)
Protein, ur: NEGATIVE mg/dL
SPECIFIC GRAVITY, URINE: 1.021 (ref 1.005–1.030)

## 2017-02-23 LAB — CBC
HCT: 35.7 % (ref 35.0–47.0)
Hemoglobin: 12.2 g/dL (ref 12.0–16.0)
MCH: 30.5 pg (ref 26.0–34.0)
MCHC: 34 g/dL (ref 32.0–36.0)
MCV: 89.4 fL (ref 80.0–100.0)
PLATELETS: 293 10*3/uL (ref 150–440)
RBC: 3.99 MIL/uL (ref 3.80–5.20)
RDW: 13.7 % (ref 11.5–14.5)
WBC: 15.2 10*3/uL — ABNORMAL HIGH (ref 3.6–11.0)

## 2017-02-23 LAB — LIPASE, BLOOD: LIPASE: 24 U/L (ref 11–51)

## 2017-02-23 MED ORDER — CIPROFLOXACIN HCL 500 MG PO TABS: 500.0000 mg | ORAL_TABLET | Freq: Two times a day (BID) | ORAL | 0 refills | Status: AC

## 2017-02-23 MED ORDER — CIPROFLOXACIN HCL 500 MG PO TABS
500.0000 mg | ORAL_TABLET | Freq: Once | ORAL | Status: AC
  Administered 2017-02-23: 500 mg via ORAL
  Filled 2017-02-23: qty 1

## 2017-02-23 MED ORDER — SODIUM CHLORIDE 0.9 % IV BOLUS (SEPSIS)
1000.0000 mL | Freq: Once | INTRAVENOUS | Status: AC
  Administered 2017-02-23: 1000 mL via INTRAVENOUS

## 2017-02-23 MED ORDER — IOPAMIDOL (ISOVUE-300) INJECTION 61%
30.0000 mL | Freq: Once | INTRAVENOUS | Status: AC | PRN
  Administered 2017-02-23: 30 mL via ORAL

## 2017-02-23 MED ORDER — MORPHINE SULFATE (PF) 4 MG/ML IV SOLN
4.0000 mg | Freq: Once | INTRAVENOUS | Status: AC
  Administered 2017-02-23: 4 mg via INTRAVENOUS
  Filled 2017-02-23: qty 1

## 2017-02-23 MED ORDER — METRONIDAZOLE 500 MG PO TABS: 500.0000 mg | ORAL_TABLET | Freq: Three times a day (TID) | ORAL | 0 refills | Status: AC

## 2017-02-23 MED ORDER — OXYCODONE-ACETAMINOPHEN 5-325 MG PO TABS: 1.0000 | ORAL_TABLET | ORAL | 0 refills | Status: DC | PRN

## 2017-02-23 MED ORDER — IOPAMIDOL (ISOVUE-300) INJECTION 61%
100.0000 mL | Freq: Once | INTRAVENOUS | Status: AC | PRN
  Administered 2017-02-23: 100 mL via INTRAVENOUS

## 2017-02-23 MED ORDER — ONDANSETRON HCL 4 MG PO TABS: 4.0000 mg | ORAL_TABLET | Freq: Three times a day (TID) | ORAL | 0 refills | Status: DC | PRN

## 2017-02-23 MED ORDER — METRONIDAZOLE 500 MG PO TABS
500.0000 mg | ORAL_TABLET | Freq: Once | ORAL | Status: AC
  Administered 2017-02-23: 500 mg via ORAL
  Filled 2017-02-23: qty 1

## 2017-02-23 MED ORDER — KETOROLAC TROMETHAMINE 30 MG/ML IJ SOLN
30.0000 mg | Freq: Once | INTRAMUSCULAR | Status: AC
  Administered 2017-02-23: 30 mg via INTRAVENOUS
  Filled 2017-02-23: qty 1

## 2017-02-23 NOTE — ED Notes (Signed)
Patient transported to CT 

## 2017-02-23 NOTE — ED Triage Notes (Signed)
Lower abdominal cramping X 2 weeks. Worse after eating.

## 2017-02-23 NOTE — Discharge Instructions (Signed)
Please seek medical attention for any high fevers, chest pain, shortness of breath, change in behavior, persistent vomiting, bloody stool or any other new or concerning symptoms.  

## 2017-02-23 NOTE — ED Provider Notes (Signed)
[facility] Emergency Department Provider Note  ____________________________________________   I have reviewed the triage vital signs and the nursing notes.   HISTORY  Chief Complaint Abdominal Pain   History limited by: Not Limited   HPI Dana West is a 41 y.o. female who presents to the emergency department today because of concerns for abdominal pain. It is located in the suprapubic and left lower quadrant. It started 2 weeks ago. It is intermittent. It will be severe and sharp in nature. She has not noticed anything that she does that brings the pain on. Has been accompanied by nausea and vomiting. No blood in the vomit. No change in defecation or urination. Patient denies similar symptoms in the past.   History reviewed. No pertinent past medical history.  There are no active problems to display for this patient.   Past Surgical History:  Procedure Laterality Date  . ABDOMINAL HYSTERECTOMY    . UTERINE FIBROID SURGERY      Prior to Admission medications   Medication Sig Start Date End Date Taking? Authorizing Provider  amoxicillin (AMOXIL) 500 MG capsule Take 1 capsule (500 mg total) by mouth 3 (three) times daily. 01/29/15   Sable Feil, PA-C  cyclobenzaprine (FLEXERIL) 5 MG tablet Take 1 tablet (5 mg total) by mouth every 8 (eight) hours as needed for muscle spasms. 08/26/15   Nance Pear, MD  fexofenadine-pseudoephedrine (ALLEGRA-D) 60-120 MG per tablet Take 1 tablet by mouth 2 (two) times daily. 01/29/15   Sable Feil, PA-C  ibuprofen (ADVIL,MOTRIN) 600 MG tablet Take 600 mg by mouth every 6 (six) hours as needed.    [provider]  magic mouthwash w/lidocaine SOLN Take 5 mLs by mouth 4 (four) times daily. 11/08/15   Cuthriell, Charline Bills, PA-C    Allergies Raspberry and Valium [diazepam]  No family history on file.  Social History Social History  Substance Use Topics  . Smoking status: Current Some Day Smoker   . Smokeless tobacco: Not on file  . Alcohol use Yes     Comment: seldom    Review of Systems Constitutional: No fever/chills Eyes: No visual changes. ENT: No sore throat. Cardiovascular: Denies chest pain. Respiratory: Denies shortness of breath. Gastrointestinal: Positive for abdominal pain.  Genitourinary: Negative for dysuria. Musculoskeletal: Negative for back pain. Skin: Negative for rash. Neurological: Negative for headaches, focal weakness or numbness.  ____________________________________________   PHYSICAL EXAM:  VITAL SIGNS: ED Triage Vitals  Enc Vitals Group     BP 02/23/17 1542 (!) 143/68     Pulse Rate 02/23/17 1542 (!) 106     Resp 02/23/17 1542 18     Temp 02/23/17 1542 99.1 F (37.3 C)     Temp Source 02/23/17 1542 Oral     SpO2 02/23/17 1542 97 %     Weight 02/23/17 1543 225 lb (102.1 kg)     Height 02/23/17 1543 5\' 7"  (1.702 m)     Head Circumference --      Peak Flow --      Pain Score 02/23/17 1712 10    Constitutional: Alert and oriented. Appears uncomfortable.  Eyes: Conjunctivae are normal.  ENT   Head: Normocephalic and atraumatic.   Nose: No congestion/rhinnorhea.   Mouth/Throat: Mucous membranes are moist.   Neck: No stridor. Hematological/Lymphatic/Immunilogical: No cervical lymphadenopathy. Cardiovascular: Tachycardic, regular rhythm.  No murmurs, rubs, or gallops.  Respiratory: Normal respiratory effort without tachypnea nor retractions. Breath sounds are clear and equal bilaterally. No wheezes/rales/rhonchi.  Gastrointestinal: Soft and tender to palpation in the LLQ and suprapubic region. Genitourinary: Deferred Musculoskeletal: Normal range of motion in all extremities. No lower extremity edema. Neurologic:  Normal speech and language. No gross focal neurologic deficits are appreciated.  Skin:  Skin is warm, dry and intact. No rash noted. Psychiatric: Mood and affect are normal. Speech and behavior are normal. Patient  exhibits appropriate insight and judgment.  ____________________________________________    LABS (pertinent positives/negatives)  Labs Reviewed  COMPREHENSIVE METABOLIC PANEL - Abnormal; Notable for the following:       Result Value   Sodium 134 (*)    Glucose, Bld 117 (*)    Total Protein 8.4 (*)    All other components within normal limits  CBC - Abnormal; Notable for the following:    WBC 15.2 (*)    All other components within normal limits  URINALYSIS, COMPLETE (UACMP) WITH MICROSCOPIC - Abnormal; Notable for the following:    Color, Urine YELLOW (*)    APPearance CLEAR (*)    Hgb urine dipstick MODERATE (*)    Squamous Epithelial / LPF 0-5 (*)    All other components within normal limits  LIPASE, BLOOD     ____________________________________________   EKG  None  ____________________________________________    RADIOLOGY  CT ab/pel IMPRESSION:  Sigmoid diverticulosis with extensive changes of active  diverticulitis. Small fluid collection which appears to be within  the wall with sigmoid colon compatible with small intramural  abscess.    Borderline sized left periaortic lymph nodes, likely reactive.     ____________________________________________   PROCEDURES  Procedures  ____________________________________________   INITIAL IMPRESSION / ASSESSMENT AND PLAN / ED COURSE  Pertinent labs & imaging results that were available during my care of the patient were reviewed by me and considered in my medical decision making (see chart for details).  Patient presented to the emergency department today because of concerns for abdominal pain. The patient underwent a CT scan which did show diverticulitis with intramural abscess. Discussed with Dr. Burt Knack. Dr. Burt Knack was willing to admit however patient stated she would like to try treating at home with oral antibiotics. Discussed return precautions. Will give patient follow-up information.  [place] drug  database was consulted prior to prescriptions.  ____________________________________________   FINAL CLINICAL IMPRESSION(S) / ED DIAGNOSES  Final diagnoses:  Diverticulitis     Note: This dictation was prepared with Dragon dictation. Any transcriptional errors that result from this process are unintentional     Nance Pear, MD 02/23/17 2015

## 2017-02-23 NOTE — ED Notes (Signed)
FN: pt presents with abd cramping for two weeks.

## 2017-02-25 ENCOUNTER — Telehealth: Payer: Self-pay | Admitting: General Practice

## 2017-02-25 NOTE — Telephone Encounter (Signed)
Unable to leave message for patient to make an appointment for when she was seen in the ED, patient needs a ED Follow-up was seen on  (02/23/17): for Diverticulitis, and Place on schedule for next available surgeon. Please schedule appointment for patient if she calls back.

## 2017-03-06 NOTE — Telephone Encounter (Signed)
Mailed letter to patient

## 2017-03-06 NOTE — Telephone Encounter (Signed)
Unable to leave message for patient, line was busy.

## 2017-03-16 DIAGNOSIS — D252 Subserosal leiomyoma of uterus: Secondary | ICD-10-CM

## 2017-03-16 HISTORY — DX: Subserosal leiomyoma of uterus: D25.2

## 2017-03-18 ENCOUNTER — Encounter: Payer: Self-pay | Admitting: Surgery

## 2017-03-18 ENCOUNTER — Telehealth: Payer: Self-pay

## 2017-03-18 ENCOUNTER — Other Ambulatory Visit
Admission: RE | Admit: 2017-03-18 | Discharge: 2017-03-18 | Disposition: A | Discharge status: Home / Self Care | Payer: 59 | Source: Ambulatory Visit | Attending: Surgery | Admitting: Surgery

## 2017-03-18 ENCOUNTER — Ambulatory Visit (INDEPENDENT_AMBULATORY_CARE_PROVIDER_SITE_OTHER): Payer: 59 | Admitting: Surgery

## 2017-03-18 ENCOUNTER — Ambulatory Visit
Admission: RE | Admit: 2017-03-18 | Discharge: 2017-03-18 | Disposition: A | Discharge status: Home / Self Care | Payer: 59 | Source: Ambulatory Visit | Attending: Surgery | Admitting: Surgery

## 2017-03-18 DIAGNOSIS — N134 Hydroureter: Secondary | ICD-10-CM

## 2017-03-18 DIAGNOSIS — R1030 Lower abdominal pain, unspecified: Secondary | ICD-10-CM | POA: Diagnosis not present

## 2017-03-18 DIAGNOSIS — R932 Abnormal findings on diagnostic imaging of liver and biliary tract: Secondary | ICD-10-CM | POA: Insufficient documentation

## 2017-03-18 DIAGNOSIS — N739 Female pelvic inflammatory disease, unspecified: Secondary | ICD-10-CM

## 2017-03-18 DIAGNOSIS — K5720 Diverticulitis of large intestine with perforation and abscess without bleeding: Secondary | ICD-10-CM | POA: Diagnosis not present

## 2017-03-18 DIAGNOSIS — K5792 Diverticulitis of intestine, part unspecified, without perforation or abscess without bleeding: Secondary | ICD-10-CM | POA: Insufficient documentation

## 2017-03-18 LAB — CBC WITH DIFFERENTIAL/PLATELET
Basophils Absolute: 0.1 10*3/uL (ref 0–0.1)
Basophils Relative: 1 %
EOS PCT: 1 %
Eosinophils Absolute: 0.1 10*3/uL (ref 0–0.7)
HEMATOCRIT: 34.4 % — AB (ref 35.0–47.0)
HEMOGLOBIN: 11.6 g/dL — AB (ref 12.0–16.0)
LYMPHS ABS: 0.8 10*3/uL — AB (ref 1.0–3.6)
LYMPHS PCT: 8 %
MCH: 29.8 pg (ref 26.0–34.0)
MCHC: 33.6 g/dL (ref 32.0–36.0)
MCV: 88.6 fL (ref 80.0–100.0)
Monocytes Absolute: 0.9 10*3/uL (ref 0.2–0.9)
Monocytes Relative: 9 %
NEUTROS ABS: 7.9 10*3/uL — AB (ref 1.4–6.5)
Neutrophils Relative %: 81 %
PLATELETS: 286 10*3/uL (ref 150–440)
RBC: 3.89 MIL/uL (ref 3.80–5.20)
RDW: 14.4 % (ref 11.5–14.5)
WBC: 9.8 10*3/uL (ref 3.6–11.0)

## 2017-03-18 LAB — COMPREHENSIVE METABOLIC PANEL
ALK PHOS: 65 U/L (ref 38–126)
ALT: 11 U/L — ABNORMAL LOW (ref 14–54)
AST: 14 U/L — ABNORMAL LOW (ref 15–41)
Albumin: 3.5 g/dL (ref 3.5–5.0)
Anion gap: 9 (ref 5–15)
BILIRUBIN TOTAL: 1 mg/dL (ref 0.3–1.2)
BUN: 8 mg/dL (ref 6–20)
CALCIUM: 9.1 mg/dL (ref 8.9–10.3)
CO2: 25 mmol/L (ref 22–32)
CREATININE: 0.87 mg/dL (ref 0.44–1.00)
Chloride: 103 mmol/L (ref 101–111)
Glucose, Bld: 89 mg/dL (ref 65–99)
Potassium: 3.6 mmol/L (ref 3.5–5.1)
Sodium: 137 mmol/L (ref 135–145)
TOTAL PROTEIN: 8.6 g/dL — AB (ref 6.5–8.1)

## 2017-03-18 MED ORDER — CIPROFLOXACIN HCL 500 MG PO TABS: 500.0000 mg | ORAL_TABLET | Freq: Two times a day (BID) | ORAL | 0 refills | Status: DC

## 2017-03-18 MED ORDER — IOPAMIDOL (ISOVUE-300) INJECTION 61%
100.0000 mL | Freq: Once | INTRAVENOUS | Status: AC | PRN
  Administered 2017-03-18: 100 mL via INTRAVENOUS

## 2017-03-18 MED ORDER — METRONIDAZOLE 500 MG PO TABS: 500.0000 mg | ORAL_TABLET | Freq: Three times a day (TID) | ORAL | 0 refills | Status: DC

## 2017-03-18 NOTE — Telephone Encounter (Signed)
Patient showed up to the clinic and I was able to give her the instructions that Dr. Dahlia Byes told me to tell her. Patient understands to start her antibiotics and not to eat or drink after midnight. I told her that she could possibly be admitted tomorrow after she is been seen. Patient understood and had no further questions.

## 2017-03-18 NOTE — Telephone Encounter (Signed)
Dr. Dahlia Byes called back to let me know that he would want the patient to start her antibiotics and him seeing her tomorrow morning.  I then called the patient and had to leave her a detailed message. Appointment had been scheduled for [place] to see her at 9:00 AM tomorrow 03-19-2017.

## 2017-03-18 NOTE — Telephone Encounter (Signed)
Tracey from [facility] called wanting to let us know about the patient's results. She also wanted to ask [place] if they should hold the patient or let her go. I then called the providers but they were all in the OR. I told Linus Orn to let the patient go and that I will call the patient with further instructions once I am able to speak to Dr. Dahlia Byes.

## 2017-03-18 NOTE — Patient Instructions (Addendum)
We need for you to go to the Lab at the Hospital to have lab work done.  Please go to Outpatient [facility] on [address].  [place]   We have sent your Medicines to the Pharmacy.  We will contact you with the results.

## 2017-03-18 NOTE — Progress Notes (Signed)
Surgical Consultation  03/18/2017  Dana West is an 41 y.o. female.   Chief Complaint  Patient presents with  . New Patient (Initial Visit)    seen in ED 02/23/17 Diverticulitis     HPI: Dana West Is a 41 year old female with a history of smoking that was referred by the emergency room after she had an episode of diverticulitis 3 weeks ago. That point the patient was treated with antibiotics and symptoms improved. The antibiotics for at least a week and now her symptoms recurred. She experiences left lower quadrant abdominal pain intermittent and some constipation. Some subjective chills. And subjective fevers. She is taking by mouth but has decreased appetite. CT scan personally reviewed showing evidence of diverticulitis with a small intramural abscess. This measures 2 cm. Evidence of perforation or free air.  Past Medical History:  Diagnosis Date  . Abnormal uterine bleeding 01/12/2015  . 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 Intramural leiomyoma of uterus 01/11/2015  . Menometrorrhagia 04/18/2014  . Submucous leiomyoma of uterus 04/18/2014  . Subserous leiomyoma of uterus 03/16/2017    Past Surgical History:  Procedure Laterality Date  . ABDOMINAL HYSTERECTOMY    . UTERINE FIBROID SURGERY      Family History  Problem Relation Age of Onset  . Alcohol abuse Neg Hx   . Arthritis Neg Hx   . Asthma Neg Hx   . Birth defects Neg Hx   . Cancer Neg Hx   . COPD Neg Hx   . Depression Neg Hx   . Diabetes Neg Hx   . Drug abuse Neg Hx   . Early death Neg Hx   . Hearing loss Neg Hx   . Heart disease Neg Hx   . Hyperlipidemia Neg Hx   . Hypertension Neg Hx   . Kidney disease Neg Hx   . Learning disabilities Neg Hx   . Mental illness Neg Hx   . Mental retardation Neg Hx   . Miscarriages / Stillbirths Neg Hx   . Stroke Neg Hx   . Vision loss Neg Hx   . Varicose Veins Neg Hx      Social History:  reports that she has been smoking.  She has been smoking about 0.25 packs per day. She has never used smokeless tobacco. She reports that she drinks alcohol. She reports that she does not use drugs.  Allergies:  Allergies  Allergen Reactions  . Hydrocodone-Acetaminophen Hives  . Raspberry Swelling and Nausea And Vomiting  . Valium [Diazepam] Hives    Medications reviewed.     ROS Full ROS performed and is otherwise negative other than what is stated in the HPI    BP (!) 146/83   Pulse 87   Temp 98.4 F (36.9 C) (Oral)   Ht 5\' 7"  (1.702 m)   Wt 105.6 kg (232 lb 12.8 oz)   LMP 01/28/2015 (Exact Date)   BMI 36.46 kg/m   Physical Exam  Constitutional: She is oriented to person, place, and time and well-developed, well-nourished, and in no distress. No distress.  Eyes: Right eye exhibits no discharge. Left eye exhibits no discharge. No scleral icterus.  Neck: Normal range of motion. No JVD present.  Cardiovascular: Normal rate and regular rhythm.   Pulmonary/Chest: Effort normal and breath sounds normal. No respiratory distress. She has no wheezes.  Abdominal: Soft. She exhibits no mass. There is tenderness. There is no rebound and  no guarding.  Moderate tenderness to palpation left lower quadrant. No peritonitis. No rebound  Musculoskeletal: Normal range of motion. She exhibits no edema.  Neurological: She is alert and oriented to person, place, and time. Gait normal. GCS score is 15.  Psychiatric: Mood, memory, affect and judgment normal.  Nursing note and vitals reviewed.   Assessment/Plan: 1. Lower abdominal pain - CT Abdomen Pelvis W Contrast; Future - CT Abdomen Pelvis W Contrast; Future - CBC with Differential - Comprehensive metabolic panel Recurrent diverticulitis. Discussed with the patient in detail about options of admission for IV antibiotics and repeating the CT scan versus outpatient management with a CT scan and by mouth antibiotics  with potentially requiring admission depending on the CT findings. Patient is adamant that if at all possible she wishes to be managed in the outpatient setting. We'll order a CT scan of the abdomen and pelvis, CBC and CMP and depending on the results may tailored therapy. She understands that there is a chance that she may have to be admitted to the hospital and there is also a very small chance that she may require a [facility] if this disease does not improve. We will update her with the results. For now have her NPO ans start cipro and flagyl  Caroleen Hamman, MD [facility]

## 2017-03-19 ENCOUNTER — Telehealth: Payer: Self-pay

## 2017-03-19 ENCOUNTER — Ambulatory Visit: Payer: Self-pay | Admitting: Surgery

## 2017-03-19 NOTE — Telephone Encounter (Signed)
Patient came in yesterday in the afternoon around 4:30 PM to know what Dr. Dahlia Byes wanted to do. I had told her her CT Scan results and that she needed to start her antibiotics. I also gave her an appointment to come in today in the morning and she did not showed up. I called her this morning and afternoon and she had not called [place] back. This information was given to Dr. Dahlia Byes. No further instructions were given. However, Dr. Dahlia Byes had been concerned about the patient. Therefore, Chanel and I had tried to get in contact with her and no answer. Hopefully patient calls [place] back soon.

## 2017-03-19 NOTE — Telephone Encounter (Signed)
Call made to patient's mother at this time and I left a message on her voicemail asking if she could please have Ms Dana West give our office a call or to stop by today before our office closes at [place]. I mentioned that Dr. Dahlia Byes was even concerned about her no showing this morning. I asked for patient's mother to have her to contact us as soon as possible.

## 2017-03-20 ENCOUNTER — Encounter: Payer: Self-pay | Admitting: *Deleted

## 2017-03-20 ENCOUNTER — Inpatient Hospital Stay
Admission: EM | Admit: 2017-03-20 | Discharge: 2017-03-24 | DRG: 392 | Disposition: A | Discharge status: Home / Self Care | Payer: 59 | Attending: Surgery | Admitting: Surgery

## 2017-03-20 DIAGNOSIS — Z888 Allergy status to other drugs, medicaments and biological substances status: Secondary | ICD-10-CM | POA: Diagnosis not present

## 2017-03-20 DIAGNOSIS — F17200 Nicotine dependence, unspecified, uncomplicated: Secondary | ICD-10-CM | POA: Diagnosis present

## 2017-03-20 DIAGNOSIS — K5792 Diverticulitis of intestine, part unspecified, without perforation or abscess without bleeding: Secondary | ICD-10-CM | POA: Diagnosis present

## 2017-03-20 DIAGNOSIS — K5720 Diverticulitis of large intestine with perforation and abscess without bleeding: Principal | ICD-10-CM | POA: Diagnosis present

## 2017-03-20 LAB — CBC
HCT: 35.9 % (ref 35.0–47.0)
Hemoglobin: 12 g/dL (ref 12.0–16.0)
MCH: 30 pg (ref 26.0–34.0)
MCHC: 33.5 g/dL (ref 32.0–36.0)
MCV: 89.4 fL (ref 80.0–100.0)
PLATELETS: 281 10*3/uL (ref 150–440)
RBC: 4.01 MIL/uL (ref 3.80–5.20)
RDW: 14.4 % (ref 11.5–14.5)
WBC: 10.8 10*3/uL (ref 3.6–11.0)

## 2017-03-20 LAB — TYPE AND SCREEN
ABO/RH(D): O POS
Antibody Screen: NEGATIVE

## 2017-03-20 LAB — COMPREHENSIVE METABOLIC PANEL
ALT: 13 U/L — AB (ref 14–54)
AST: 15 U/L (ref 15–41)
Albumin: 3.5 g/dL (ref 3.5–5.0)
Alkaline Phosphatase: 62 U/L (ref 38–126)
Anion gap: 9 (ref 5–15)
BUN: 11 mg/dL (ref 6–20)
CHLORIDE: 103 mmol/L (ref 101–111)
CO2: 25 mmol/L (ref 22–32)
CREATININE: 0.74 mg/dL (ref 0.44–1.00)
Calcium: 9.4 mg/dL (ref 8.9–10.3)
GFR calc Af Amer: >60 mL/min (ref >60)
GFR calc non Af Amer: >60 mL/min (ref >60)
GLUCOSE: 97 mg/dL (ref 65–99)
Potassium: 3.7 mmol/L (ref 3.5–5.1)
SODIUM: 137 mmol/L (ref 135–145)
Total Bilirubin: 0.7 mg/dL (ref 0.3–1.2)
Total Protein: 9.1 g/dL — ABNORMAL HIGH (ref 6.5–8.1)

## 2017-03-20 LAB — URINALYSIS, COMPLETE (UACMP) WITH MICROSCOPIC
BILIRUBIN URINE: NEGATIVE
Bacteria, UA: NONE SEEN
Glucose, UA: NEGATIVE mg/dL
KETONES UR: 5 mg/dL — AB
Leukocytes, UA: NEGATIVE
Nitrite: NEGATIVE
Protein, ur: 30 mg/dL — AB
Specific Gravity, Urine: 1.029 (ref 1.005–1.030)
pH: 5 (ref 5.0–8.0)

## 2017-03-20 LAB — HCG, QUANTITATIVE, PREGNANCY: hCG, Beta Chain, Quant, S: 1 m[IU]/mL (ref <5)

## 2017-03-20 LAB — LIPASE, BLOOD: LIPASE: 19 U/L (ref 11–51)

## 2017-03-20 LAB — LACTIC ACID, PLASMA: LACTIC ACID, VENOUS: 0.8 mmol/L (ref 0.5–1.9)

## 2017-03-20 MED ORDER — PIPERACILLIN-TAZOBACTAM 3.375 G IVPB
3.3750 g | Freq: Three times a day (TID) | INTRAVENOUS | Status: DC
  Administered 2017-03-20 – 2017-03-24 (×11): 3.375 g via INTRAVENOUS
  Filled 2017-03-20 (×12): qty 50

## 2017-03-20 MED ORDER — PIPERACILLIN-TAZOBACTAM 3.375 G IVPB 30 MIN
INTRAVENOUS | Status: AC
  Administered 2017-03-20: 3.375 g via INTRAVENOUS
  Filled 2017-03-20: qty 50

## 2017-03-20 MED ORDER — FENTANYL CITRATE (PF) 100 MCG/2ML IJ SOLN
INTRAMUSCULAR | Status: AC
  Administered 2017-03-20: 75 ug via INTRAVENOUS
  Filled 2017-03-20: qty 2

## 2017-03-20 MED ORDER — PIPERACILLIN-TAZOBACTAM 3.375 G IVPB 30 MIN
3.3750 g | Freq: Once | INTRAVENOUS | Status: AC
  Administered 2017-03-20: 3.375 g via INTRAVENOUS
  Filled 2017-03-20: qty 50

## 2017-03-20 MED ORDER — HYDROMORPHONE HCL 1 MG/ML IJ SOLN
0.5000 mg | INTRAMUSCULAR | Status: DC | PRN
  Administered 2017-03-20 – 2017-03-21 (×2): 0.5 mg via INTRAVENOUS
  Filled 2017-03-20 (×2): qty 0.5

## 2017-03-20 MED ORDER — SODIUM CHLORIDE 0.9 % IV BOLUS (SEPSIS)
1000.0000 mL | Freq: Once | INTRAVENOUS | Status: AC
  Administered 2017-03-20: 1000 mL via INTRAVENOUS

## 2017-03-20 MED ORDER — HEPARIN SODIUM (PORCINE) 5000 UNIT/ML IJ SOLN
5000.0000 [IU] | Freq: Three times a day (TID) | INTRAMUSCULAR | Status: DC
  Administered 2017-03-21 – 2017-03-24 (×8): 5000 [IU] via SUBCUTANEOUS
  Filled 2017-03-20 (×9): qty 1

## 2017-03-20 MED ORDER — ONDANSETRON HCL 4 MG PO TABS: 4.0000 mg | ORAL_TABLET | Freq: Four times a day (QID) | ORAL | Status: DC | PRN

## 2017-03-20 MED ORDER — ONDANSETRON HCL 4 MG/2ML IJ SOLN
4.0000 mg | Freq: Four times a day (QID) | INTRAMUSCULAR | Status: DC | PRN
  Administered 2017-03-21 – 2017-03-22 (×3): 4 mg via INTRAVENOUS
  Filled 2017-03-20 (×3): qty 2

## 2017-03-20 MED ORDER — FENTANYL CITRATE (PF) 100 MCG/2ML IJ SOLN
75.0000 ug | Freq: Once | INTRAMUSCULAR | Status: AC
  Administered 2017-03-20: 75 ug via INTRAVENOUS

## 2017-03-20 MED ORDER — LACTATED RINGERS IV SOLN
INTRAVENOUS | Status: DC
  Administered 2017-03-20 – 2017-03-23 (×8): via INTRAVENOUS

## 2017-03-20 MED ORDER — ONDANSETRON HCL 4 MG/2ML IJ SOLN
4.0000 mg | Freq: Once | INTRAMUSCULAR | Status: AC
  Administered 2017-03-20: 4 mg via INTRAVENOUS
  Filled 2017-03-20: qty 2

## 2017-03-20 NOTE — ED Notes (Signed)
AAOx3.  Skin warm and dry.  NAD 

## 2017-03-20 NOTE — ED Triage Notes (Signed)
First Nurse Note:  Arrives with C/O LLQ pain.  Has known history of Diverticulitis, patient's PCP sent patient to ED for evaluation.

## 2017-03-20 NOTE — H&P (Signed)
Dana West is an 40 y.o. female.    Chief Complaint: Left lower quadrant pain  HPI: This patient with known diverticulitis. She's been to the emergency room and is seen Dr. Dahlia Byes in the office. She had a recent CT scan showing acute diverticulitis. She came to the emergency room at the suggestion of our office to be admitted for IV antibiotics.  She describes 1 month of abdominal pain mostly left lower quadrant and suprapubic area. It is cramping in nature she's had no fevers or chills but has not felt well and is quite tearful about this episode.  She has no melena or hematochezia.  She has no family history of colon cancer.  She works in a call center smokes tobacco does not drink much alcohol.  Past Medical History:  Diagnosis Date  . Abnormal uterine bleeding 01/12/2015  . 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 Intramural leiomyoma of uterus 01/11/2015  . Menometrorrhagia 04/18/2014  . Submucous leiomyoma of uterus 04/18/2014  . Subserous leiomyoma of uterus 03/16/2017    Past Surgical History:  Procedure Laterality Date  . ABDOMINAL HYSTERECTOMY    . UTERINE FIBROID SURGERY      Family History  Problem Relation Age of Onset  . Alcohol abuse Neg Hx   . Arthritis Neg Hx   . Asthma Neg Hx   . Birth defects Neg Hx   . Cancer Neg Hx   . COPD Neg Hx   . Depression Neg Hx   . Diabetes Neg Hx   . Drug abuse Neg Hx   . Early death Neg Hx   . Hearing loss Neg Hx   . Heart disease Neg Hx   . Hyperlipidemia Neg Hx   . Hypertension Neg Hx   . Kidney disease Neg Hx   . Learning disabilities Neg Hx   . Mental illness Neg Hx   . Mental retardation Neg Hx   . Miscarriages / Stillbirths Neg Hx   . Stroke Neg Hx   . Vision loss Neg Hx   . Varicose Veins Neg Hx    Social History:  reports that she has been smoking.  She has been smoking about 0.25 packs per day. She has never  used smokeless tobacco. She reports that she drinks alcohol. She reports that she does not use drugs.  Allergies:  Allergies  Allergen Reactions  . Hydrocodone-Acetaminophen Hives  . Raspberry Swelling and Nausea And Vomiting  . Valium [Diazepam] Hives     (Not in a hospital admission)   Review of Systems  Constitutional: Negative for chills, fever and weight loss.  HENT: Negative.   Eyes: Negative.   Respiratory: Negative.   Cardiovascular: Negative.   Gastrointestinal: Positive for abdominal pain, nausea and vomiting. Negative for blood in stool, constipation, diarrhea and heartburn.  Genitourinary: Negative.   Musculoskeletal: Negative.   Skin: Negative.   Neurological: Negative.   Endo/Heme/Allergies: Negative.   Psychiatric/Behavioral: Negative.      Physical Exam:  BP 112/87 (BP Location: Left Arm)   Pulse 75   Temp 98.2 F (36.8 C) (Oral)   Resp 16   Ht '5\' 7"'  (1.702 m)   Wt 232 lb (105.2 kg)   LMP 01/28/2015 (Exact Date)   SpO2 96%   BMI 36.34 kg/m   Physical Exam  Constitutional: She is oriented to person, place, and time and well-developed, well-nourished, and in no distress. No distress.  Uncomfortable-appearing female patient. She is quite tearful  HENT:  Head: Normocephalic and atraumatic.  Eyes: Pupils are equal, round, and reactive to light. Right eye exhibits no discharge. Left eye exhibits no discharge. No scleral icterus.  Neck: Normal range of motion. No JVD present.  Cardiovascular: Normal rate, regular rhythm and normal heart sounds.   Pulmonary/Chest: Effort normal and breath sounds normal. No respiratory distress. She has no wheezes. She has no rales.  Abdominal: Soft. She exhibits no distension. There is tenderness. There is no rebound and no guarding.  Tenderness in both lower quadrants maximal in left lower quadrant suprapubic area. No peritoneal signs.  Musculoskeletal: Normal range of motion. She exhibits no edema or tenderness.   Lymphadenopathy:    She has no cervical adenopathy.  Neurological: She is alert and oriented to person, place, and time.  Skin: Skin is warm and dry. No rash noted. She is not diaphoretic. No erythema.  Psychiatric: Mood and affect normal.  Vitals reviewed.       Results for orders placed or performed during the hospital encounter of 03/20/17 (from the past 48 hour(s))  Lipase, blood     Status: None   Collection Time: 03/20/17 12:49 PM  Result Value Ref Range   Lipase 19 11 - 51 U/L  Comprehensive metabolic panel     Status: Abnormal   Collection Time: 03/20/17 12:49 PM  Result Value Ref Range   Sodium 137 135 - 145 mmol/L   Potassium 3.7 3.5 - 5.1 mmol/L   Chloride 103 101 - 111 mmol/L   CO2 25 22 - 32 mmol/L   Glucose, Bld 97 65 - 99 mg/dL   BUN 11 6 - 20 mg/dL   Creatinine, Ser 0.74 0.44 - 1.00 mg/dL   Calcium 9.4 8.9 - 10.3 mg/dL   Total Protein 9.1 (H) 6.5 - 8.1 g/dL   Albumin 3.5 3.5 - 5.0 g/dL   AST 15 15 - 41 U/L   ALT 13 (L) 14 - 54 U/L   Alkaline Phosphatase 62 38 - 126 U/L   Total Bilirubin 0.7 0.3 - 1.2 mg/dL   GFR calc non Af Amer >60 >60 mL/min   GFR calc Af Amer >60 >60 mL/min    Comment: (NOTE) The eGFR has been calculated using the CKD EPI equation. This calculation has not been validated in all clinical situations. eGFR's persistently <60 mL/min signify possible Chronic Kidney Disease.    Anion gap 9 5 - 15  CBC     Status: None   Collection Time: 03/20/17 12:49 PM  Result Value Ref Range   WBC 10.8 3.6 - 11.0 K/uL   RBC 4.01 3.80 - 5.20 MIL/uL   Hemoglobin 12.0 12.0 - 16.0 g/dL   HCT 35.9 35.0 - 47.0 %   MCV 89.4 80.0 - 100.0 fL   MCH 30.0 26.0 - 34.0 pg   MCHC 33.5 32.0 - 36.0 g/dL   RDW 14.4 11.5 - 14.5 %   Platelets 281 150 - 440 K/uL  hCG, quantitative, pregnancy     Status: None   Collection Time: 03/20/17 12:49 PM  Result Value Ref Range   hCG, Beta Chain, Quant, S 1 <5 mIU/mL    Comment:          GEST. AGE      CONC.  (mIU/mL)    <=1 WEEK        5 - 50     2 WEEKS       50 -  500     3 WEEKS       100 - 10,000     4 WEEKS     1,000 - 30,000     5 WEEKS     3,500 - 115,000   6-8 WEEKS     12,000 - 270,000    12 WEEKS     15,000 - 220,000        FEMALE AND NON-PREGNANT FEMALE:     LESS THAN 5 mIU/mL   Lactic acid, plasma     Status: None   Collection Time: 03/20/17  2:40 PM  Result Value Ref Range   Lactic Acid, Venous 0.8 0.5 - 1.9 mmol/L  Type and screen [place]     Status: None   Collection Time: 03/20/17  2:40 PM  Result Value Ref Range   ABO/RH(D) O POS    Antibody Screen NEG    Sample Expiration 03/23/2017    No results found.   Assessment/Plan  This patient with a one-month episode of left lower quadrant pain her workup showing diverticulitis. She's been in the emergency room before and is in the emergency room now having been sent over from Dr. Candis Schatz office after CT scan suggested acute diverticulitis. A CT scan has been personally reviewed and shows a very small abscess laterally. It is not amenable to drainage and is too small to drain at this time. However the patient would benefit from inpatient admission with IV antibiotics. The rationale for this been discussed the options of continued home oral antibiotics were reviewed and the risks of worsening needing surgery or colostomy were reviewed with her she understood and agreed with this plan  Florene Glen, MD, FACS

## 2017-03-20 NOTE — Progress Notes (Signed)
   03/20/17 1900  Clinical Encounter Type  Visited With Patient  Visit Type Initial;Spiritual support  Referral From Nurse  Spiritual Encounters  Spiritual Needs Literature  Advance Directives (For Healthcare)  Does Patient Have a Medical Advance Directive? No  Would patient like information on creating a medical advance directive? Yes (Inpatient - patient requests chaplain consult to create a medical advance directive)  [place] Directives  Does Patient Have a Mental Health Advance Directive? No  Would patient like information on creating a mental health advance directive? No - Patient declined  HCPOA/AD materials dropped off with patient.  [place] available to review as needed.

## 2017-03-20 NOTE — ED Provider Notes (Signed)
[facility] Emergency Department Provider Note  ____________________________________________   First MD Initiated Contact with Patient 03/20/17 1434     (approximate)  I have reviewed the triage vital signs and the nursing notes.   HISTORY  Chief Complaint Abdominal Pain    HPI Dana West is a 41 y.o. female who sent to the emergency department by Dr. Dahlia Byes for admission for intra-abdominal infection. The patient has a history of diverticulitis for which she had been taking oral antibiotics. She feels like her symptoms have never fully resolved. She has continual constant aching moderate severity lower abdominal pain and constipation. She has nausea but has not vomited. 2 days ago she was seen in clinic and had an outpatient CT scan performed. She called the clinic today for results and they advised her to come to the hospital. She denies fevers or chills. She is not currently taking any antibiotics.   Past Medical History:  Diagnosis Date  . Abnormal uterine bleeding 01/12/2015  . 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 Intramural leiomyoma of uterus 01/11/2015  . Menometrorrhagia 04/18/2014  . Submucous leiomyoma of uterus 04/18/2014  . Subserous leiomyoma of uterus 03/16/2017    Patient Active Problem List   Diagnosis Date Noted  . 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 Abnormal uterine bleeding 01/12/2015  . 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 Intramural leiomyoma of uterus 01/11/2015  . 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 Menometrorrhagia 04/18/2014  . Submucous leiomyoma of uterus 04/18/2014    Past Surgical History:  Procedure Laterality Date  . ABDOMINAL HYSTERECTOMY    . UTERINE FIBROID SURGERY      Prior to Admission medications   Medication Sig Start Date End Date  Taking? Authorizing Provider  ciprofloxacin (CIPRO) 500 MG tablet Take 1 tablet (500 mg total) by mouth 2 (two) times daily. 03/18/17   Pabon, Diego F, MD  ibuprofen (ADVIL,MOTRIN) 600 MG tablet Take 600 mg by mouth every 6 (six) hours as needed.    [provider]  metroNIDAZOLE (FLAGYL) 500 MG tablet Take 1 tablet (500 mg total) by mouth 3 (three) times daily. 03/18/17   Pabon, [place], MD  ondansetron (ZOFRAN) 4 MG tablet Take 1 tablet (4 mg total) by mouth every 8 (eight) hours as needed for nausea or vomiting. 02/23/17   Nance Pear, MD    Allergies Hydrocodone-acetaminophen; Genevive Bi; and Valium [diazepam]  Family History  Problem Relation Age of Onset  . Alcohol abuse Neg Hx   . Arthritis Neg Hx   . Asthma Neg Hx   . Birth defects Neg Hx   . Cancer Neg Hx   . COPD Neg Hx   . Depression Neg Hx   . Diabetes Neg Hx   . Drug abuse Neg Hx   . Early death Neg Hx   . Hearing loss Neg Hx   . Heart disease Neg Hx   . Hyperlipidemia Neg Hx   . Hypertension Neg Hx   . Kidney disease Neg Hx   . Learning disabilities Neg Hx   . Mental illness Neg Hx   . Mental retardation Neg Hx   . Miscarriages / Stillbirths Neg Hx   . Stroke Neg Hx   . Vision loss Neg Hx   . Varicose Veins Neg Hx     Social History Social History  Substance  Use Topics  . Smoking status: Current Some Day Smoker    Packs/day: 0.25  . Smokeless tobacco: Never Used  . Alcohol use Yes     Comment: seldom    Review of Systems Constitutional: No fever/chills Eyes: No visual changes. ENT: No sore throat. Cardiovascular: Denies chest pain. Respiratory: Denies shortness of breath. Gastrointestinal: positive for abdominal pain.  positive for nausea, no vomiting.  No diarrhea.  positive for constipation. Genitourinary: Negative for dysuria. Musculoskeletal: Negative for back pain. Skin: Negative for rash. Neurological: Negative for headaches, focal weakness or  numbness.   ____________________________________________   PHYSICAL EXAM:  VITAL SIGNS: ED Triage Vitals  Enc Vitals Group     BP 03/20/17 1251 (!) 141/77     Pulse Rate 03/20/17 1251 (!) 112     Resp 03/20/17 1251 18     Temp 03/20/17 1251 98.2 F (36.8 C)     Temp Source 03/20/17 1251 Oral     SpO2 03/20/17 1251 100 %     Weight 03/20/17 1251 232 lb (105.2 kg)     Height 03/20/17 1251 5\' 7"  (1.702 m)     Head Circumference --      Peak Flow --      Pain Score 03/20/17 1250 10     Pain Loc --      Pain Edu? --      Excl. in [place]? --     Constitutional: alert and oriented 4 appears uncomfortable and nontoxic no diaphoresis speaks in full clear sentences Eyes: PERRL EOMI. Head: Atraumatic. Nose: No congestion/rhinnorhea. Mouth/Throat: No trismus Neck: No stridor.   Cardiovascular: tachycardic rate, regular rhythm. Grossly normal heart sounds.  Good peripheral circulation. Respiratory: Normal respiratory effort.  No retractions. Lungs CTAB and moving good air Gastrointestinal: soft abdomen with no frank peritonitis although exquisitely tender in the left lower quadrant with rebound and guarding Musculoskeletal: No lower extremity edema   Neurologic:  Normal speech and language. No gross focal neurologic deficits are appreciated. Skin:  Skin is warm, dry and intact. No rash noted. Psychiatric: Mood and affect are normal. Speech and behavior are normal.    ____________________________________________   DIFFERENTIAL includes but not limited to  diverticulitis, abscess, perforation, fistula ____________________________________________   LABS (all labs ordered are listed, but only abnormal results are displayed)  Labs Reviewed  COMPREHENSIVE METABOLIC PANEL - Abnormal; Notable for the following:       Result Value   Total Protein 9.1 (*)    ALT 13 (*)    All other components within normal limits  CULTURE, BLOOD (ROUTINE X 2)  CULTURE, BLOOD (ROUTINE X 2)  LIPASE,  BLOOD  CBC  URINALYSIS, COMPLETE (UACMP) WITH MICROSCOPIC  LACTIC ACID, PLASMA  LACTIC ACID, PLASMA  HCG, QUANTITATIVE, PREGNANCY  TYPE AND SCREEN    blood work reviewed and interpreted by me is within normal limits __________________________________________  EKG   ____________________________________________  RADIOLOGY  CT scan abdomen and pelvis performed 2 days ago reviewed by me shows acute diverticulitis with 3.2 cm abscess ____________________________________________   PROCEDURES  Procedure(s) performed: no  Procedures  Critical Care performed: no  Observation: no ____________________________________________   INITIAL IMPRESSION / ASSESSMENT AND PLAN / ED COURSE  Pertinent labs & imaging results that were available during my care of the patient were reviewed by me and considered in my medical decision making (see chart for details).  The patient arrives tachycardic and uncomfortable appearing with a concerning abdominal exam. She has not eaten since midnight.  Her CT scan 2 days ago confirms active diverticulitis with intra-abdominal abscess. I will reach out to general surgery for further recommendations.     ----------------------------------------- 2:40 PM on 03/20/2017 ----------------------------------------- I discussed the case with Dr. Burt Knack of Gen. surgery who has asked me to give the patient a fluid bolus, to keep her nothing by mouth, and to give her a dose of Zosyn. She will require inpatient admission for surgical correction within 24 hours.  ____________________________________________   FINAL CLINICAL IMPRESSION(S) / ED DIAGNOSES  Final diagnoses:  Diverticulitis  Colonic diverticular abscess      NEW MEDICATIONS STARTED DURING THIS VISIT:  New Prescriptions   No medications on file     Note:  This document was prepared using Dragon voice recognition software and may include unintentional dictation errors.     Darel Hong, MD 03/20/17 1504

## 2017-03-20 NOTE — ED Provider Notes (Signed)
.     Darel Hong, MD 03/21/17 1430

## 2017-03-20 NOTE — ED Triage Notes (Signed)
Pt had CT yesterday, sent to ED today by surgeon for questionable blockage, pt complains of LLQ pain with nausea and vomiting

## 2017-03-21 LAB — BASIC METABOLIC PANEL
ANION GAP: 6 (ref 5–15)
BUN: 8 mg/dL (ref 6–20)
CALCIUM: 8.8 mg/dL — AB (ref 8.9–10.3)
CO2: 26 mmol/L (ref 22–32)
Chloride: 108 mmol/L (ref 101–111)
Creatinine, Ser: 0.92 mg/dL (ref 0.44–1.00)
GFR calc Af Amer: >60 mL/min (ref >60)
Glucose, Bld: 87 mg/dL (ref 65–99)
Potassium: 3.5 mmol/L (ref 3.5–5.1)
Sodium: 140 mmol/L (ref 135–145)

## 2017-03-21 LAB — CBC
HCT: 31.5 % — ABNORMAL LOW (ref 35.0–47.0)
HEMOGLOBIN: 10.7 g/dL — AB (ref 12.0–16.0)
MCH: 30 pg (ref 26.0–34.0)
MCHC: 34.1 g/dL (ref 32.0–36.0)
MCV: 87.9 fL (ref 80.0–100.0)
Platelets: 246 10*3/uL (ref 150–440)
RBC: 3.58 MIL/uL — AB (ref 3.80–5.20)
RDW: 14.2 % (ref 11.5–14.5)
WBC: 7.9 10*3/uL (ref 3.6–11.0)

## 2017-03-21 MED ORDER — HYDROMORPHONE HCL 1 MG/ML IJ SOLN
0.5000 mg | INTRAMUSCULAR | Status: DC | PRN
  Administered 2017-03-21 – 2017-03-23 (×14): 0.5 mg via INTRAVENOUS
  Filled 2017-03-21 (×15): qty 0.5

## 2017-03-21 NOTE — Progress Notes (Signed)
Patient was awaken in pain about 0650. Notified Dr.Cooper of patient's condition.

## 2017-03-21 NOTE — Progress Notes (Signed)
CC: Acute diverticulitis Subjective: This a patient admitted the hospital with acute diverticulitis. She complains of ongoing but improved cramping abdominal pain maximal in the left lower quadrant. She's had some nausea but no further emesis. No fevers or chills. She had a bowel movement this morning and is passing gas.  Objective: Vital signs in last 24 hours: Temp:  [97.8 F (36.6 C)-98.9 F (37.2 C)] 98.7 F (37.1 C) (10/06 0514) Pulse Rate:  [67-112] 79 (10/06 0514) Resp:  [16-20] 20 (10/06 0514) BP: (112-144)/(65-99) 144/70 (10/06 0514) SpO2:  [96 %-100 %] 98 % (10/06 0514) Weight:  [230 lb 6.4 oz (104.5 kg)-232 lb (105.2 kg)] 230 lb 6.4 oz (104.5 kg) (10/05 1828) Last BM Date: 03/21/17  Intake/Output from previous day: 10/05 0701 - 10/06 0700 In: 2002.5 [I.V.:1852.5; IV Piggyback:150] Out: 1000 [Urine:1000] Intake/Output this shift: Total I/O In: 2229 [P.O.:240; I.V.:1954; IV Piggyback:35] Out: -   Physical exam:  Vital signs are reviewed and stable. Patient appears uncomfortable and appears to be experiencing cramping abdominal pain. Abdominal exam demonstrates no tenderness in the right lower quadrant minimal tenderness in the left lower quadrant and suprapubic area. No peritoneal signs. calves are nontender neuro is grossly intact  Lab Results: CBC   Recent Labs  03/20/17 1249 03/21/17 0446  WBC 10.8 7.9  HGB 12.0 10.7*  HCT 35.9 31.5*  PLT 281 246   BMET  Recent Labs  03/20/17 1249 03/21/17 0446  NA 137 140  K 3.7 3.5  CL 103 108  CO2 25 26  GLUCOSE 97 87  BUN 11 8  CREATININE 0.74 0.92  CALCIUM 9.4 8.8*   PT/INR No results for input(s): LABPROT, INR in the last 72 hours. ABG No results for input(s): PHART, HCO3 in the last 72 hours.  Invalid input(s): PCO2, PO2  Studies/Results: No results found.  Anti-infectives: Anti-infectives    Start     Dose/Rate Route Frequency Ordered Stop   03/20/17 2000  piperacillin-tazobactam (ZOSYN) IVPB  3.375 g     3.375 g 12.5 mL/hr over 240 Minutes Intravenous Every 8 hours 03/20/17 1700     03/20/17 1445  piperacillin-tazobactam (ZOSYN) IVPB 3.375 g     3.375 g 100 mL/hr over 30 Minutes Intravenous  Once 03/20/17 1441 03/20/17 1539      Assessment/Plan:  This patient with a hospital with a diagnosis of acute diverticulitis with small abscess. That abscess is not amenable to drainage as it is too small to do so. She is currently on IV antibiotics. Continuing current therapy at this point as there is no indication for surgery in this patient who seems to be improving but still experiencing cramping abdominal pain. White blood cell count is normal.  Florene Glen, MD, FACS  03/21/2017

## 2017-03-22 LAB — HIV ANTIBODY (ROUTINE TESTING W REFLEX): HIV SCREEN 4TH GENERATION: NONREACTIVE

## 2017-03-22 MED ORDER — ALUM & MAG HYDROXIDE-SIMETH 200-200-20 MG/5ML PO SUSP
30.0000 mL | ORAL | Status: DC | PRN
  Administered 2017-03-22: 30 mL via ORAL
  Filled 2017-03-22: qty 30

## 2017-03-22 MED ORDER — ACETAMINOPHEN 325 MG PO TABS
650.0000 mg | ORAL_TABLET | Freq: Four times a day (QID) | ORAL | Status: DC | PRN
  Administered 2017-03-22 (×2): 650 mg via ORAL
  Filled 2017-03-22 (×2): qty 2

## 2017-03-22 NOTE — Progress Notes (Signed)
CC: Diverticulitis Subjective: Patient is feeling much much better today she has no nausea vomiting fevers or chills although she had a slight fever last night. She wants to advance her diet.  Objective: Vital signs in last 24 hours: Temp:  [97.9 F (36.6 C)-100.7 F (38.2 C)] 97.9 F (36.6 C) (10/07 0647) Pulse Rate:  [78-83] 83 (10/07 0448) Resp:  [20] 20 (10/07 0448) BP: (130-153)/(62-73) 153/73 (10/07 0448) SpO2:  [96 %-100 %] 96 % (10/07 0448) Last BM Date: 03/22/17  Intake/Output from previous day: 10/06 0701 - 10/07 0700 In: 5021.5 [P.O.:1900; I.V.:3036.5; IV Piggyback:85] Out: 6226 [Urine:2550] Intake/Output this shift: No intake/output data recorded.  Physical exam:  Abdomen much less tender and soft. Calves are nontender no icterus no jaundice vital signs are stable low-grade temp last night.  Lab Results: CBC   Recent Labs  03/20/17 1249 03/21/17 0446  WBC 10.8 7.9  HGB 12.0 10.7*  HCT 35.9 31.5*  PLT 281 246   BMET  Recent Labs  03/20/17 1249 03/21/17 0446  NA 137 140  K 3.7 3.5  CL 103 108  CO2 25 26  GLUCOSE 97 87  BUN 11 8  CREATININE 0.74 0.92  CALCIUM 9.4 8.8*   PT/INR No results for input(s): LABPROT, INR in the last 72 hours. ABG No results for input(s): PHART, HCO3 in the last 72 hours.  Invalid input(s): PCO2, PO2  Studies/Results: No results found.  Anti-infectives: Anti-infectives    Start     Dose/Rate Route Frequency Ordered Stop   03/20/17 2000  piperacillin-tazobactam (ZOSYN) IVPB 3.375 g     3.375 g 12.5 mL/hr over 240 Minutes Intravenous Every 8 hours 03/20/17 1700     03/20/17 1445  piperacillin-tazobactam (ZOSYN) IVPB 3.375 g     3.375 g 100 mL/hr over 30 Minutes Intravenous  Once 03/20/17 1441 03/20/17 1539      Assessment/Plan:  Patient is doing quite well at this point she has improved considerably overnight but did have a low-grade fever. Consideration will be made for repeating a CAT scan prior to her  discharge but I believe she needs to stay on antibiotics for another day or 2. We'll advance diet.  Florene Glen, MD, FACS  03/22/2017

## 2017-03-22 NOTE — Progress Notes (Signed)
Called Dr. Dahlia Byes regarding fever medication for patient.  Appropriate orders were placed.  Dana West  03/22/2017  4:52 AM

## 2017-03-22 NOTE — Progress Notes (Signed)
Initial Nutrition Assessment  DOCUMENTATION CODES:   Obesity unspecified  INTERVENTION:  Reviewed "Low-Fiber Nutrition Therapy" handout from the Academy of [employer] Dietetics. Discussed that surgeon will likely advance patient's diet slowly as tolerated to a soft diet (low-fiber, low-fat). Reviewed low-fiber diet with patient and encouraged her to follow until her acute diverticulitis has resolved. Discussed that after resolution of diverticulitis, she can slowly add fiber foods back in. Encouraged adequate fluid intake.  No further nutrition interventions warranted at this time. Patient feels she will be able to eat well once she is advanced past a liquid diet. Will continue to monitor.  NUTRITION DIAGNOSIS:   Inadequate oral intake related to poor appetite, other (see comment) (abdominal pain, constipation, acute diverticulitis) as evidenced by per patient/family report.  GOAL:   Patient will meet greater than or equal to 90% of their needs  MONITOR:   PO intake, Diet advancement, Labs, Weight trends, I & O's  REASON FOR ASSESSMENT:   Malnutrition Screening Tool    ASSESSMENT:   41 year old female with known diverticulitis presented with left lower quadrant abdominal pain and admitted with acute diverticulitis with small abscess for IV antibiotics.   Met with patient at bedside. She reports she has had abdominal pain and constipation for the past 1.5 months. She has been on oral antibiotics PTA without much relief. She reports that recently she has lost her appetite and just does not feel hungry. Now that she is on a liquid diet she is not eating well because she does not like the items available on the menu. She typically eats 1-2 meals per day. Reports she usually skips breakfast. She feels like she ate well up until a few days PTA. Patient reports she will be able to eat fine when her diet is advanced. She does not want any oral nutrition supplements.  UBW 230 lbs. Patient  has remained weight stable.  Meal Completion: variable; 75% of lunch yesterday, 0% of dinner last night, 100% of breakfast this morning  Medications reviewed and include: heparin, LR @ 150 ml/hr, Zosyn IV.  Labs reviewed: Hgb 10.7 (trending down), Hct 31.5 (trending down).  Nutrition-Focused physical exam completed. Findings are no fat depletion, no muscle depletion, and no edema.   Patient does not meet criteria for malnutrition at this time.  Diet Order:  Diet full liquid Room service appropriate? Yes; Fluid consistency: Thin  Skin:  Reviewed, no issues  Last BM:  03/22/2017 per chart - stool characteristics not documented  Height:   Ht Readings from Last 1 Encounters:  03/20/17 '5\' 7"'  (1.702 m)    Weight:   Wt Readings from Last 1 Encounters:  03/20/17 230 lb 6.4 oz (104.5 kg)    Ideal Body Weight:  61.4 kg  BMI:  Body mass index is 36.09 kg/m.  Estimated Nutritional Needs:   Kcal:  5035-4656 (MSJ x 1.2-1.3)  Protein:  105-115 grams (1-1.1 grams/kg)  Fluid:  1.8-2.1 L/day (30-35 ml/kg IBW)  EDUCATION NEEDS:   Education needs addressed  Willey Blade, [place], [place], [place] Office: [phone] Pager: [phone] After Hours/Weekend Pager: [phone]

## 2017-03-23 ENCOUNTER — Inpatient Hospital Stay: Payer: 59

## 2017-03-23 MED ORDER — IBUPROFEN 400 MG PO TABS
600.0000 mg | ORAL_TABLET | Freq: Four times a day (QID) | ORAL | Status: DC | PRN
  Administered 2017-03-24: 600 mg via ORAL
  Filled 2017-03-23: qty 2

## 2017-03-23 MED ORDER — IOPAMIDOL (ISOVUE-300) INJECTION 61%
15.0000 mL | INTRAVENOUS | Status: AC
  Administered 2017-03-23 (×2): 15 mL via ORAL

## 2017-03-23 MED ORDER — ALUM & MAG HYDROXIDE-SIMETH 200-200-20 MG/5ML PO SUSP
30.0000 mL | ORAL | Status: DC | PRN
  Filled 2017-03-23: qty 30

## 2017-03-23 MED ORDER — IOPAMIDOL (ISOVUE-300) INJECTION 61%: 100.0000 mL | Freq: Once | INTRAVENOUS | Status: DC | PRN

## 2017-03-23 NOTE — Care Management (Signed)
Admitted with acute diverticulitis requiring IV antibiotics.  Tolerating full liquid diet.  No discharge needs identified at present time.

## 2017-03-23 NOTE — Progress Notes (Signed)
SURGICAL PROGRESS NOTE (cpt [phone])  Hospital Day(s): 3.   Post op day(s):  Dana West   Interval History: Patient seen and examined, no acute events or new complaints overnight. Patient reports her now mild lower abdominal pain has been well-controlled with decreasing need for narcotic pain medication, tolerating full liquids diet, ambulating without difficulty, and denies fever/chills, N/V, CP, or SOB.  Review of Systems:  Constitutional: denies fever, chills  HEENT: denies cough or congestion  Respiratory: denies any shortness of breath  Cardiovascular: denies chest pain or palpitations  Gastrointestinal: abdominal pain, N/V, and bowel function as per interval history Genitourinary: denies burning with urination or urinary frequency Musculoskeletal: denies pain, decreased motor or sensation Integumentary: denies any other rashes or skin discolorations Neurological: denies HA or vision/hearing changes   Vital signs in last 24 hours: [min-max] current  Temp:  [98.3 F (36.8 C)-101.9 F (38.8 C)] 98.3 F (36.8 C) (10/08 0458) Pulse Rate:  [72-86] 72 (10/08 0458) Resp:  [18-20] 18 (10/08 0458) BP: (123-139)/(54-76) 123/54 (10/08 0458) SpO2:  [94 %-100 %] 100 % (10/08 0458)     Height: 5\' 7"  (170.2 cm) Weight: 230 lb 6.4 oz (104.5 kg) BMI (Calculated): 36.08   Intake/Output this shift:  Total I/O In: 3030 [I.V.:2780; IV Piggyback:250] Out: -    Intake/Output last 2 shifts:  @IOLAST2SHIFTS @   Physical Exam:  Constitutional: alert, cooperative and no distress  HENT: normocephalic without obvious abnormality  Eyes: PERRL, EOM's grossly intact and symmetric  Neuro: CN II - XII grossly intact and symmetric without deficit  Respiratory: breathing non-labored at rest  Cardiovascular: regular rate and sinus rhythm  Gastrointestinal: soft, minimal LLQ tenderness to even deep palpation, and non-distended Musculoskeletal: UE and LE FROM, no edema or wounds, motor and sensation grossly  intact, NT   Labs:  CBC Latest Ref Rng & Units 03/21/2017 03/20/2017 03/18/2017  WBC 3.6 - 11.0 K/uL 7.9 10.8 9.8  Hemoglobin 12.0 - 16.0 g/dL 10.7(L) 12.0 11.6(L)  Hematocrit 35.0 - 47.0 % 31.5(L) 35.9 34.4(L)  Platelets 150 - 440 K/uL 246 281 286   CMP Latest Ref Rng & Units 03/21/2017 03/20/2017 03/18/2017  Glucose 65 - 99 mg/dL 87 97 89  BUN 6 - 20 mg/dL 8 11 8   Creatinine 0.44 - 1.00 mg/dL 0.92 0.74 0.87  Sodium 135 - 145 mmol/L 140 137 137  Potassium 3.5 - 5.1 mmol/L 3.5 3.7 3.6  Chloride 101 - 111 mmol/L 108 103 103  CO2 22 - 32 mmol/L 26 25 25   Calcium 8.9 - 10.3 mg/dL 8.8(L) 9.4 9.1  Total Protein 6.5 - 8.1 g/dL - 9.1(H) 8.6(H)  Total Bilirubin 0.3 - 1.2 mg/dL - 0.7 1.0  Alkaline Phos 38 - 126 U/L - 62 65  AST 15 - 41 U/L - 15 14(L)  ALT 14 - 54 U/L - 13(L) 11(L)   Imaging studies: No new pertinent imaging studies   Assessment/Plan: (ICD-10's: K34.20) 41 y.o. female with improved abdominal pain and resolved leukocytosis secondary to sigmoid colonic diverticulitis with pelvic abscess not amenable to percutaneous image-guided drainage, complicated by pertinent comorbidities including chronic ongoing tobacco abuse.   - IV antibiotics  - advance to soft/low fiber diet  - pain control prn, minimize narcotics   - follow-up pending CT abdomen and pelvis to reassess peri-colonic abscess   - discharge planning with 2 weeks oral antibiotics and outpatient follow-up  - DVT prophylaxis, ambulation encouraged  All of the above findings and recommendations were discussed with the patient and patient's  family, and all of patient's and family's questions were answered to their expressed satisfaction.  -- Marilynne Drivers Rosana Hoes, MD, [place]: [facility] - Partnering for exceptional care. Office: [phone]

## 2017-03-24 LAB — CBC
HCT: 29.5 % — ABNORMAL LOW (ref 35.0–47.0)
Hemoglobin: 10.1 g/dL — ABNORMAL LOW (ref 12.0–16.0)
MCH: 29.6 pg (ref 26.0–34.0)
MCHC: 34 g/dL (ref 32.0–36.0)
MCV: 87 fL (ref 80.0–100.0)
PLATELETS: 266 10*3/uL (ref 150–440)
RBC: 3.4 MIL/uL — AB (ref 3.80–5.20)
RDW: 14 % (ref 11.5–14.5)
WBC: 8.2 10*3/uL (ref 3.6–11.0)

## 2017-03-24 MED ORDER — AMOXICILLIN-POT CLAVULANATE 875-125 MG PO TABS
1.0000 | ORAL_TABLET | Freq: Once | ORAL | Status: AC
  Administered 2017-03-24: 1 via ORAL
  Filled 2017-03-24: qty 1

## 2017-03-24 MED ORDER — AMOXICILLIN-POT CLAVULANATE 875-125 MG PO TABS: 1.0000 | ORAL_TABLET | Freq: Two times a day (BID) | ORAL | 0 refills | Status: DC

## 2017-03-24 NOTE — Progress Notes (Signed)
Patient had two episodes of vomiting, 1st episode contents not observed, but patient reported having bright red blood in contents. The second episode content was observed with presence of 2 specs of bright red blood. Patient asked if the amount of blood present in the 2nd episode was the same amount in 1st episode patient stated " yes". No exacerbation in pain and patient denied the need for anti-nausea medication. Will notify provider.

## 2017-03-24 NOTE — Progress Notes (Signed)
Dana West to be D/C'd home per MD order.  Discussed prescriptions and follow up appointments with the patient. Prescriptions given to patient, medication list explained in detail. Pt verbalized understanding.    Vitals:   03/23/17 2204 03/24/17 0452  BP: (!) 153/77 (!) 142/72  Pulse: 88 79  Resp: 20 20  Temp: 99.5 F (37.5 C) 98.3 F (36.8 C)  SpO2: 96% 97%    Skin clean, dry and intact without evidence of skin break down, no evidence of skin tears noted. IV catheter discontinued intact. Site without signs and symptoms of complications. Dressing and pressure applied. Pt denies pain at this time. No complaints noted.  An After Visit Summary was printed and given to the patient. Patient escorted via [place], and D/C home via private auto.  [name] A

## 2017-03-25 ENCOUNTER — Telehealth: Payer: Self-pay | Admitting: General Practice

## 2017-03-25 LAB — CULTURE, BLOOD (ROUTINE X 2)
CULTURE: NO GROWTH
CULTURE: NO GROWTH
SPECIAL REQUESTS: ADEQUATE
SPECIAL REQUESTS: ADEQUATE

## 2017-03-25 NOTE — Telephone Encounter (Signed)
Patient came by the office and dropped off fmla paperwork, patient is wanting to return to work this Saturday 03/28/17. Patient is coming in on 04/01/17. Paperwork has been paid for and has been placed in the folder up front. Please call patient and advice. Patient would also like a copy for herself.

## 2017-03-28 NOTE — Discharge Summary (Signed)
Physician Discharge Summary  Patient ID: Dana West MRN: [id number] DOB/AGE: 43-Jan-1977 43 y.o.  Admit date: 03/20/2017 Discharge date: 03/28/2017  Admission Diagnoses:  Discharge Diagnoses:  Active Problems:   Diverticulitis   Discharged Condition: good  Hospital Course: 41 y.o. female presented to [facility] ED upon referral from outpatient surgery office for IV antibiotics following CT demonstrating acute diverticulitis with pericolonic and intramural abscesses not amenable to image-guided drainage per IR consultation. Patient's pain improved, and advancement of patient's diet and ambulation were well-tolerated. The remainder of patient's hospital course was essentially unremarkable, and discharge planning was initiated with patient safely able to be discharged home with appropriate discharge instructions, antibiotics, and outpatient surgical follow-up after all of her questions were answered to her expressed satisfaction.  Consults: Interventional Radiology  Significant Diagnostic Studies: labs: WBC 15.2 --> 8.2 and radiology: CT scan: Acute sigmoid colonic diverticulitis with pericolonic and intramural abscesses not amenable to image-guided drainage and without increase in size in comparison to admission CT, but with less extraluminal air  Treatments: IV hydration and antibiotics: Zosyn  Discharge Exam: Blood pressure (!) 142/72, pulse 79, temperature 98.3 F (36.8 C), temperature source Oral, resp. rate 20, height 5\' 7"  (1.702 m), weight 230 lb 6.4 oz (104.5 kg), last menstrual period 01/28/2015, SpO2 97 %. General appearance: alert, cooperative and no distress GI: soft, non-tender; bowel sounds normal; no masses,  no organomegaly  Disposition: 01-Home or Self Care   Allergies as of 03/24/2017      Reactions   Hydrocodone-acetaminophen Hives   Raspberry Swelling, Nausea And Vomiting   Valium [diazepam] Hives      Medication List    STOP taking these medications    ciprofloxacin 500 MG tablet Commonly known as:  CIPRO   metroNIDAZOLE 500 MG tablet Commonly known as:  FLAGYL     TAKE these medications   amoxicillin-clavulanate 875-125 MG tablet Commonly known as:  AUGMENTIN Take 1 tablet by mouth 2 (two) times daily.   ibuprofen 600 MG tablet Commonly known as:  ADVIL,MOTRIN Take 600 mg by mouth every 6 (six) hours as needed.   ondansetron 4 MG tablet Commonly known as:  ZOFRAN Take 1 tablet (4 mg total) by mouth every 8 (eight) hours as needed for nausea or vomiting.   oxycodone 5 MG capsule Commonly known as:  OXY-IR Take 5 mg by mouth every 4 (four) hours as needed.      Follow-up Information    Clayburn Pert, MD. Go on 04/01/2017.   Specialty:  General Surgery Why:  Wednesday at 9:15am for hospital follow-up Contact information: [address] [phone]           Signed: Vickie Epley 03/28/2017, 8:08 PM

## 2017-03-30 NOTE — Telephone Encounter (Signed)
Patient's FMLA was filled out and faxed.

## 2017-04-01 ENCOUNTER — Encounter: Payer: Self-pay | Admitting: General Surgery

## 2017-04-01 ENCOUNTER — Ambulatory Visit (INDEPENDENT_AMBULATORY_CARE_PROVIDER_SITE_OTHER): Payer: 59 | Admitting: General Surgery

## 2017-04-01 VITALS — BP 149/83 | HR 76 | Temp 97.9°F | Ht 67.0 in | Wt 227.6 lb

## 2017-04-01 DIAGNOSIS — K5792 Diverticulitis of intestine, part unspecified, without perforation or abscess without bleeding: Secondary | ICD-10-CM | POA: Diagnosis not present

## 2017-04-01 MED ORDER — AMOXICILLIN-POT CLAVULANATE 875-125 MG PO TABS: 1.0000 | ORAL_TABLET | Freq: Two times a day (BID) | ORAL | 0 refills | Status: DC

## 2017-04-01 NOTE — Patient Instructions (Signed)
Please finish all of the antibiotics. Please pick up your medicine at the pharmacy. Please see your follow up appointment listed below.

## 2017-04-01 NOTE — Progress Notes (Signed)
Outpatient Surgical Follow Up  04/01/2017  Dana West is an 41 y.o. female.   Chief Complaint  Patient presents with  . Follow-up    Diverticulitis    HPI: 41 year old female returns to clinic for follow-up from recent hospitalization for diverticulitis. Patient is still on antibiotics with an additional week remaining. She reports that her pain has not completely gone away but is much better. She is tolerating a diet but will become nauseous if she eats too much. The majority of her pain is in her lower abdomen or her tailbone depending on how she is position. She denies any recent fevers, chills, vomiting, chest pain, shortness of breath. She reports she is having some difficulty having a bowel movementbut is having daily small bowel movements.  Past Medical History:  Diagnosis Date  . Abnormal uterine bleeding 01/12/2015  . 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 Intramural leiomyoma of uterus 01/11/2015  . Menometrorrhagia 04/18/2014  . Submucous leiomyoma of uterus 04/18/2014  . Subserous leiomyoma of uterus 03/16/2017    Past Surgical History:  Procedure Laterality Date  . ABDOMINAL HYSTERECTOMY    . UTERINE FIBROID SURGERY      Family History  Problem Relation Age of Onset  . Alcohol abuse Neg Hx   . Arthritis Neg Hx   . Asthma Neg Hx   . Birth defects Neg Hx   . Cancer Neg Hx   . COPD Neg Hx   . Depression Neg Hx   . Diabetes Neg Hx   . Drug abuse Neg Hx   . Early death Neg Hx   . Hearing loss Neg Hx   . Heart disease Neg Hx   . Hyperlipidemia Neg Hx   . Hypertension Neg Hx   . Kidney disease Neg Hx   . Learning disabilities Neg Hx   . Mental illness Neg Hx   . Mental retardation Neg Hx   . Miscarriages / Stillbirths Neg Hx   . Stroke Neg Hx   . Vision loss Neg Hx   . Varicose Veins Neg Hx     Social History:  reports that she has been smoking.  She has been  smoking about 0.25 packs per day. She has never used smokeless tobacco. She reports that she does not drink alcohol or use drugs.  Allergies:  Allergies  Allergen Reactions  . Hydrocodone-Acetaminophen Hives  . Raspberry Swelling and Nausea And Vomiting  . Valium [Diazepam] Hives    Medications reviewed.    ROS A multipoint review of systems was completed, all pertinent positives and negatives are documented in the history of present illness and remainder are negative   BP (!) 149/83   Pulse 76   Temp 97.9 F (36.6 C) (Oral)   Ht 5\' 7"  (1.702 m)   Wt 103.2 kg (227 lb 9.6 oz)   LMP 01/28/2015 (Exact Date)   BMI 35.65 kg/m   Physical Exam Gen.: No acute distress Chest: Clear to auscultation Heart: Regular rhythm Abdomen: Soft, moderately tender to deep palpation left lower quadrant, nondistended no evidence of peritonitis.    No results found for this or any previous visit (from the past 48 hour(s)). No results found.  Assessment/Plan:  1. Diverticulitis 41 year old female with sigmoid diverticulitis. Continues to bemildly symptomatic. Discussed that in some cases it takes a prolonged course of antibiotics for the diverticulitis to completely resolved. Discussed the signs and symptoms of  worsening diverticulitis and to return to clinic for the ER immediately should they occur. Otherwise I'll provide her with a refill of her antibiotics and she will follow-up in clinic in 1 week for an additional follow-up exam.  A total of 15 minutes was used on this encounter with greater than 50% of it used for counseling and coordination of care.    Clayburn Pert, MD FACS General Surgeon  04/01/2017,9:29 AM

## 2017-04-03 ENCOUNTER — Inpatient Hospital Stay
Admission: EM | Admit: 2017-04-03 | Discharge: 2017-04-09 | DRG: 330 | Disposition: A | Discharge status: Home / Self Care | Payer: 59 | Attending: Surgery | Admitting: Surgery

## 2017-04-03 ENCOUNTER — Encounter: Payer: Self-pay | Admitting: Emergency Medicine

## 2017-04-03 ENCOUNTER — Emergency Department: Payer: 59 | Admitting: Certified Registered Nurse Anesthetist

## 2017-04-03 ENCOUNTER — Emergency Department: Payer: 59

## 2017-04-03 ENCOUNTER — Encounter
Admission: EM | Disposition: A | Discharge status: Home / Self Care | Payer: Self-pay | Source: Home / Self Care | Attending: Surgery

## 2017-04-03 DIAGNOSIS — E669 Obesity, unspecified: Secondary | ICD-10-CM | POA: Diagnosis present

## 2017-04-03 DIAGNOSIS — Z90710 Acquired absence of both cervix and uterus: Secondary | ICD-10-CM

## 2017-04-03 DIAGNOSIS — R109 Unspecified abdominal pain: Secondary | ICD-10-CM | POA: Diagnosis not present

## 2017-04-03 DIAGNOSIS — D649 Anemia, unspecified: Secondary | ICD-10-CM | POA: Diagnosis present

## 2017-04-03 DIAGNOSIS — Z91018 Allergy to other foods: Secondary | ICD-10-CM

## 2017-04-03 DIAGNOSIS — K5720 Diverticulitis of large intestine with perforation and abscess without bleeding: Secondary | ICD-10-CM | POA: Diagnosis not present

## 2017-04-03 DIAGNOSIS — N736 Female pelvic peritoneal adhesions (postinfective): Secondary | ICD-10-CM | POA: Diagnosis present

## 2017-04-03 DIAGNOSIS — R188 Other ascites: Secondary | ICD-10-CM | POA: Diagnosis present

## 2017-04-03 DIAGNOSIS — Z888 Allergy status to other drugs, medicaments and biological substances status: Secondary | ICD-10-CM

## 2017-04-03 DIAGNOSIS — Z885 Allergy status to narcotic agent status: Secondary | ICD-10-CM

## 2017-04-03 DIAGNOSIS — Z6836 Body mass index (BMI) 36.0-36.9, adult: Secondary | ICD-10-CM

## 2017-04-03 DIAGNOSIS — K631 Perforation of intestine (nontraumatic): Secondary | ICD-10-CM

## 2017-04-03 DIAGNOSIS — F17210 Nicotine dependence, cigarettes, uncomplicated: Secondary | ICD-10-CM | POA: Diagnosis present

## 2017-04-03 DIAGNOSIS — K567 Ileus, unspecified: Secondary | ICD-10-CM | POA: Diagnosis not present

## 2017-04-03 DIAGNOSIS — Z79899 Other long term (current) drug therapy: Secondary | ICD-10-CM

## 2017-04-03 DIAGNOSIS — K668 Other specified disorders of peritoneum: Secondary | ICD-10-CM | POA: Diagnosis present

## 2017-04-03 HISTORY — PX: COLECTOMY WITH COLOSTOMY CREATION/HARTMANN PROCEDURE: SHX6598

## 2017-04-03 HISTORY — DX: Obesity, unspecified: E66.9

## 2017-04-03 LAB — COMPREHENSIVE METABOLIC PANEL
ALBUMIN: 3 g/dL — AB (ref 3.5–5.0)
ALK PHOS: 56 U/L (ref 38–126)
ALT: 12 U/L — ABNORMAL LOW (ref 14–54)
ANION GAP: 12 (ref 5–15)
AST: 20 U/L (ref 15–41)
BUN: 11 mg/dL (ref 6–20)
CALCIUM: 9 mg/dL (ref 8.9–10.3)
CHLORIDE: 104 mmol/L (ref 101–111)
CO2: 22 mmol/L (ref 22–32)
Creatinine, Ser: 0.87 mg/dL (ref 0.44–1.00)
GFR calc non Af Amer: >60 mL/min (ref >60)
GLUCOSE: 125 mg/dL — AB (ref 65–99)
POTASSIUM: 2.9 mmol/L — AB (ref 3.5–5.1)
SODIUM: 138 mmol/L (ref 135–145)
Total Bilirubin: 0.5 mg/dL (ref 0.3–1.2)
Total Protein: 8.7 g/dL — ABNORMAL HIGH (ref 6.5–8.1)

## 2017-04-03 LAB — TYPE AND SCREEN
ABO/RH(D): O POS
Antibody Screen: NEGATIVE

## 2017-04-03 LAB — CBC
HEMATOCRIT: 40.3 % (ref 35.0–47.0)
HEMOGLOBIN: 13.2 g/dL (ref 12.0–16.0)
MCH: 28.8 pg (ref 26.0–34.0)
MCHC: 32.7 g/dL (ref 32.0–36.0)
MCV: 88 fL (ref 80.0–100.0)
Platelets: 529 10*3/uL — ABNORMAL HIGH (ref 150–440)
RBC: 4.58 MIL/uL (ref 3.80–5.20)
RDW: 14.4 % (ref 11.5–14.5)
WBC: 9.6 10*3/uL (ref 3.6–11.0)

## 2017-04-03 LAB — LACTIC ACID, PLASMA: LACTIC ACID, VENOUS: 1.3 mmol/L (ref 0.5–1.9)

## 2017-04-03 LAB — LIPASE, BLOOD: LIPASE: 15 U/L (ref 11–51)

## 2017-04-03 SURGERY — COLECTOMY, WITH COLOSTOMY CREATION
Anesthesia: General | Site: Abdomen | Wound class: Dirty or Infected

## 2017-04-03 MED ORDER — HALOPERIDOL LACTATE 5 MG/ML IJ SOLN
2.5000 mg | Freq: Once | INTRAMUSCULAR | Status: AC
  Administered 2017-04-03: 2.5 mg via INTRAVENOUS
  Filled 2017-04-03: qty 1

## 2017-04-03 MED ORDER — MORPHINE SULFATE (PF) 4 MG/ML IV SOLN
6.0000 mg | Freq: Once | INTRAVENOUS | Status: AC
  Administered 2017-04-03: 6 mg via INTRAVENOUS
  Filled 2017-04-03: qty 2

## 2017-04-03 MED ORDER — SODIUM CHLORIDE 0.9 % IV SOLN
INTRAVENOUS | Status: DC | PRN
  Administered 2017-04-03: 23:00:00 via INTRAVENOUS

## 2017-04-03 MED ORDER — SODIUM CHLORIDE 0.9 % IV SOLN
INTRAVENOUS | Status: DC | PRN
  Administered 2017-04-03: via INTRAVENOUS

## 2017-04-03 MED ORDER — IOPAMIDOL (ISOVUE-300) INJECTION 61%
100.0000 mL | Freq: Once | INTRAVENOUS | Status: AC | PRN
  Administered 2017-04-03: 100 mL via INTRAVENOUS

## 2017-04-03 MED ORDER — PROPOFOL 10 MG/ML IV BOLUS
INTRAVENOUS | Status: DC | PRN
  Administered 2017-04-03: 200 mg via INTRAVENOUS

## 2017-04-03 MED ORDER — LIDOCAINE HCL (PF) 2 % IJ SOLN
INTRAMUSCULAR | Status: AC
  Filled 2017-04-03: qty 10

## 2017-04-03 MED ORDER — SUCCINYLCHOLINE CHLORIDE 20 MG/ML IJ SOLN
INTRAMUSCULAR | Status: DC | PRN
  Administered 2017-04-03: 100 mg via INTRAVENOUS

## 2017-04-03 MED ORDER — SUCCINYLCHOLINE CHLORIDE 20 MG/ML IJ SOLN
INTRAMUSCULAR | Status: AC
  Filled 2017-04-03: qty 1

## 2017-04-03 MED ORDER — POTASSIUM CHLORIDE 10 MEQ/100ML IV SOLN: 10.0000 meq | INTRAVENOUS | Status: AC

## 2017-04-03 MED ORDER — PIPERACILLIN-TAZOBACTAM 3.375 G IVPB
3.3750 g | Freq: Three times a day (TID) | INTRAVENOUS | Status: DC
  Administered 2017-04-04 – 2017-04-09 (×16): 3.375 g via INTRAVENOUS
  Filled 2017-04-03 (×15): qty 50

## 2017-04-03 MED ORDER — ROCURONIUM BROMIDE 50 MG/5ML IV SOLN
INTRAVENOUS | Status: AC
  Filled 2017-04-03: qty 1

## 2017-04-03 MED ORDER — MIDAZOLAM HCL 2 MG/2ML IJ SOLN
INTRAMUSCULAR | Status: AC
  Filled 2017-04-03: qty 2

## 2017-04-03 MED ORDER — DEXAMETHASONE SODIUM PHOSPHATE 10 MG/ML IJ SOLN
INTRAMUSCULAR | Status: AC
  Filled 2017-04-03: qty 1

## 2017-04-03 MED ORDER — SODIUM CHLORIDE 0.9 % IV BOLUS (SEPSIS)
1000.0000 mL | Freq: Once | INTRAVENOUS | Status: AC
  Administered 2017-04-03: 1000 mL via INTRAVENOUS

## 2017-04-03 MED ORDER — FENTANYL CITRATE (PF) 100 MCG/2ML IJ SOLN
INTRAMUSCULAR | Status: AC
  Filled 2017-04-03: qty 2

## 2017-04-03 MED ORDER — GLYCOPYRROLATE 0.2 MG/ML IJ SOLN
INTRAMUSCULAR | Status: AC
  Filled 2017-04-03: qty 1

## 2017-04-03 MED ORDER — PIPERACILLIN-TAZOBACTAM 3.375 G IVPB 30 MIN
3.3750 g | Freq: Once | INTRAVENOUS | Status: AC
  Administered 2017-04-03: 3.375 g via INTRAVENOUS
  Filled 2017-04-03: qty 50

## 2017-04-03 MED ORDER — LIDOCAINE HCL (CARDIAC) 20 MG/ML IV SOLN
INTRAVENOUS | Status: DC | PRN
  Administered 2017-04-03: 100 mg via INTRAVENOUS

## 2017-04-03 MED ORDER — PROPOFOL 10 MG/ML IV BOLUS
INTRAVENOUS | Status: AC
  Filled 2017-04-03: qty 20

## 2017-04-03 MED ORDER — ONDANSETRON HCL 4 MG/2ML IJ SOLN
4.0000 mg | Freq: Once | INTRAMUSCULAR | Status: AC
  Administered 2017-04-03: 4 mg via INTRAVENOUS
  Filled 2017-04-03: qty 2

## 2017-04-03 MED ORDER — ONDANSETRON HCL 4 MG/2ML IJ SOLN
INTRAMUSCULAR | Status: AC
Start: 2017-04-03 — End: 2017-04-03
  Filled 2017-04-03: qty 2

## 2017-04-03 MED ORDER — FENTANYL CITRATE (PF) 100 MCG/2ML IJ SOLN
INTRAMUSCULAR | Status: DC | PRN
  Administered 2017-04-03 – 2017-04-04 (×2): 50 ug via INTRAVENOUS

## 2017-04-03 SURGICAL SUPPLY — 50 items
APPLIER CLIP 11 MED OPEN (CLIP)
APPLIER CLIP 13 LRG OPEN (CLIP)
BULB RESERV EVAC DRAIN JP 100C (MISCELLANEOUS) ×6 IMPLANT
CELL SAVER LIPIGURD (MISCELLANEOUS) ×1 IMPLANT
CHLORAPREP W/TINT 26ML (MISCELLANEOUS) ×3 IMPLANT
CLIP APPLIE 11 MED OPEN (CLIP) IMPLANT
CLIP APPLIE 13 LRG OPEN (CLIP) IMPLANT
COVER LIGHT HANDLE STERIS (MISCELLANEOUS) ×3 IMPLANT
DRAIN CHANNEL JP 19F (MISCELLANEOUS) ×6 IMPLANT
DRSG OPSITE POSTOP 4X10 (GAUZE/BANDAGES/DRESSINGS) ×3 IMPLANT
DRSG OPSITE POSTOP 4X8 (GAUZE/BANDAGES/DRESSINGS) IMPLANT
ELECT BLADE 6 FLAT ULTRCLN (ELECTRODE) ×3 IMPLANT
ELECT CAUTERY BLADE 6.4 (BLADE) ×3 IMPLANT
ELECT REM PT RETURN 9FT ADLT (ELECTROSURGICAL) ×3
ELECTRODE REM PT RTRN 9FT ADLT (ELECTROSURGICAL) ×1 IMPLANT
EXTRT SYSTEM ALEXIS 14CM (MISCELLANEOUS) ×3
EXTRT SYSTEM ALEXIS 17CM (MISCELLANEOUS)
GLOVE BIOGEL M 7.0 STRL (GLOVE) ×9 IMPLANT
GLOVE BIOGEL PI IND STRL 7.5 (GLOVE) ×3 IMPLANT
GLOVE BIOGEL PI INDICATOR 7.5 (GLOVE) ×6
GOWN STRL REUS W/TWL LRG LVL3 (GOWN DISPOSABLE) ×9 IMPLANT
HANDLE SUCTION POOLE (INSTRUMENTS) ×1 IMPLANT
HANDLE YANKAUER SUCT BULB TIP (MISCELLANEOUS) IMPLANT
LIGASURE IMPACT 36 18CM CVD LR (INSTRUMENTS) ×3 IMPLANT
NEEDLE HYPO 18GX1.5 BLUNT FILL (NEEDLE) IMPLANT
NEEDLE HYPO 21X1.5 SAFETY (NEEDLE) ×3 IMPLANT
NS IRRIG 1000ML POUR BTL (IV SOLUTION) ×6 IMPLANT
PACK BASIN MAJOR ARMC (MISCELLANEOUS) ×3 IMPLANT
RELOAD LINEAR CUT PROX 55 BLUE (ENDOMECHANICALS) ×3 IMPLANT
SLEEVE SCD COMPRESS THIGH MED (MISCELLANEOUS) ×3 IMPLANT
SPONGE DRAIN TRACH 4X4 STRL 2S (GAUZE/BANDAGES/DRESSINGS) ×3 IMPLANT
SPONGE LAP 18X18 5 PK (GAUZE/BANDAGES/DRESSINGS) ×3 IMPLANT
STAPLER CUT CVD 40MM GREEN (STAPLE) ×3 IMPLANT
STAPLER PROXIMATE 55 BLUE (STAPLE) ×3 IMPLANT
STAPLER SKIN PROX 35W (STAPLE) ×3 IMPLANT
SUCTION POOLE HANDLE (INSTRUMENTS) ×3
SUT PDS AB 1 TP1 96 (SUTURE) ×6 IMPLANT
SUT PROLENE 4 0 SH DA (SUTURE) ×3 IMPLANT
SUT SILK 2 0 (SUTURE) ×2
SUT SILK 2-0 18XBRD TIE 12 (SUTURE) ×1 IMPLANT
SUT SILK 3-0 (SUTURE) ×3 IMPLANT
SUT VIC AB 3-0 SH 27 (SUTURE) ×4
SUT VIC AB 3-0 SH 27X BRD (SUTURE) ×2 IMPLANT
SWAB CULTURE AMIES ANAERIB BLU (MISCELLANEOUS) ×3 IMPLANT
SYR 20CC LL (SYRINGE) IMPLANT
SYR 30ML LL (SYRINGE) ×6 IMPLANT
SYSTEM CONTND EXTRCTN KII BLLN (MISCELLANEOUS) IMPLANT
SYSTEM UROSTOMY GENTLE TOUCH (WOUND CARE) ×3 IMPLANT
TOWEL OR 17X26 4PK STRL BLUE (TOWEL DISPOSABLE) IMPLANT
TRAY FOLEY W/METER SILVER 16FR (SET/KITS/TRAYS/PACK) ×3 IMPLANT

## 2017-04-03 NOTE — Consult Note (Signed)
SURGICAL CONSULTATION NOTE (initial) - cpt: 99254  HISTORY OF PRESENT ILLNESS (HPI):  41 y.o. obese West well-known to our service for recent prolonged history of sigmoid diverticulitis with intramural and pelvic abscesses, treated with antibiotics and last seen in surgery office 2 days ago, presented to [facility] ED today for evaluation of acute worsening of abdominal pain. Patient reports her "usual" lower abdominal pain suddenly became worse ~ 3 pm this afternoon, along with nausea and followed by non-bloody emesis. Patient describes she regularly experiences chills, which she attributes to chronic anemia, and she states the abdominal pain makes it difficult to take deep breaths, but her breathing improves with pain control and she denies chest pain or tightness. Patient's last BM, less loose "than usual" was this morning, and she says she has not eaten anything since early this morning and feels thirsty. Of note, she also says she has only smoked one cigarette since being discharged from [facility] on 10/9.  Surgery is consulted by ED physician Dr. Mable Paris in this context for evaluation and management of perforated sigmoid diverticulitis.  PAST MEDICAL HISTORY (PMH):  Past Medical History:  Diagnosis Date  . Abnormal uterine bleeding 01/12/2015  . 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 Intramural leiomyoma of uterus 01/11/2015  . Menometrorrhagia 04/18/2014  . Obesity (BMI 35.0-39.9 without comorbidity)   . Submucous leiomyoma of uterus 04/18/2014  . Subserous leiomyoma of uterus 03/16/2017     PAST SURGICAL HISTORY [facility]):  Past Surgical History:  Procedure Laterality Date  . ABDOMINAL HYSTERECTOMY  2017  . UTERINE FIBROID SURGERY       MEDICATIONS:  Prior to Admission medications   Medication Sig Start Date End Date Taking? Authorizing Provider  amoxicillin-clavulanate (AUGMENTIN) 875-125 MG tablet  Take 1 tablet by mouth 2 (two) times daily. 04/01/17 04/15/17  Clayburn Pert, MD  ibuprofen (ADVIL,MOTRIN) 600 MG tablet Take 600 mg by mouth every 6 (six) hours as needed.    [provider]  ondansetron (ZOFRAN) 4 MG tablet Take 1 tablet (4 mg total) by mouth every 8 (eight) hours as needed for nausea or vomiting. 02/23/17   Nance Pear, MD     ALLERGIES:  Allergies  Allergen Reactions  . Hydrocodone-Acetaminophen Hives  . Raspberry Swelling and Nausea And Vomiting  . Valium [Diazepam] Hives     SOCIAL HISTORY:  Social History   Social History  . Marital status: Single    Spouse name: N/A  . Number of children: N/A  . Years of education: N/A   Occupational History  . Not on file.   Social History Main Topics  . Smoking status: Current Some Day Smoker    Packs/day: 0.25  . Smokeless tobacco: Never Used  . Alcohol use No     Comment: seldom  . Drug use: No  . Sexual activity: No   Other Topics Concern  . Not on file   Social History Narrative  . No narrative on file    The patient currently resides (home / rehab facility / nursing home): Home The patient normally is (ambulatory / bedbound): Ambulatory   FAMILY HISTORY:  Family History  Problem Relation Age of Onset  . Alcohol abuse Neg Hx   . Arthritis Neg Hx   . Asthma Neg Hx   . Birth defects Neg Hx   . Cancer Neg Hx   . COPD Neg Hx   . Depression Neg Hx   .  Diabetes Neg Hx   . Drug abuse Neg Hx   . Early death Neg Hx   . Hearing loss Neg Hx   . Heart disease Neg Hx   . Hyperlipidemia Neg Hx   . Hypertension Neg Hx   . Kidney disease Neg Hx   . Learning disabilities Neg Hx   . Mental illness Neg Hx   . Mental retardation Neg Hx   . Miscarriages / Stillbirths Neg Hx   . Stroke Neg Hx   . Vision loss Neg Hx   . Varicose Veins Neg Hx      REVIEW OF SYSTEMS:  Constitutional: denies weight loss, fever, chills, or sweats  Eyes: denies any other vision changes, history of eye injury   ENT: denies sore throat, hearing problems  Respiratory: denies shortness of breath, wheezing  Cardiovascular: denies chest pain, palpitations  Gastrointestinal: abdominal pain, N/V, and bowel function as per HPI Genitourinary: denies burning with urination or urinary frequency Musculoskeletal: denies any other joint pains or cramps  Skin: denies any other rashes or skin discolorations  Neurological: denies any other headache, dizziness, weakness  Psychiatric: denies any other depression, anxiety   All other review of systems were negative   VITAL SIGNS:  Temp:  [98.8 F (37.1 C)] 98.8 F (37.1 C) (10/19 2103) Pulse Rate:  [107-134] 107 (10/19 2225) Resp:  [16] 16 (10/19 2225) BP: (175)/(95) 175/95 (10/19 2103) SpO2:  [97 %-98 %] 97 % (10/19 2225) Weight:  [227 lb (103 kg)] 227 lb (103 kg) (10/19 2104)     Height: 5\' 7"  (170.2 cm) Weight: 227 lb (103 kg) BMI (Calculated): 35.55   INTAKE/OUTPUT:  This shift: No intake/output data recorded.  Last 2 shifts: @IOLAST2SHIFTS @   PHYSICAL EXAM:  Constitutional:  -- Obese body habitus  -- Awake, alert, and oriented x3  Eyes:  -- Pupils equally round and reactive to light  -- No scleral icterus  Ear, nose, and throat:  -- No jugular venous distension  Pulmonary:  -- No crackles  -- Equal breath sounds bilaterally -- Breathing non-labored at rest Cardiovascular:  -- S1, S2 present  -- No pericardial rubs Gastrointestinal:  -- Abdomen soft, obese, and non-distended with moderate diffuse abdominal pain, no guarding or rebound tenderness -- No abdominal masses appreciated, pulsatile or otherwise  Musculoskeletal and Integumentary:  -- Wounds or skin discoloration: None appreciated -- Extremities: B/L UE and LE FROM, hands and feet warm  Neurologic:  -- Motor function: intact and symmetric -- Sensation: intact and symmetric  Labs:  CBC Latest Ref Rng & Units 04/03/2017 03/24/2017 03/21/2017  WBC 3.6 - 11.0 K/uL 9.6 8.2 7.9   Hemoglobin 12.0 - 16.0 g/dL 13.2 10.1(L) 10.7(L)  Hematocrit 35.0 - 47.0 % 40.3 29.5(L) 31.5(L)  Platelets 150 - 440 K/uL 529(H) 266 246   CMP Latest Ref Rng & Units 04/03/2017 03/21/2017 03/20/2017  Glucose 65 - 99 mg/dL 125(H) 87 97  BUN 6 - 20 mg/dL 11 8 11   Creatinine 0.44 - 1.00 mg/dL 0.87 0.92 0.Dana  Sodium 135 - 145 mmol/L 138 140 137  Potassium 3.5 - 5.1 mmol/L 2.9(L) 3.5 3.7  Chloride 101 - 111 mmol/L 104 108 103  CO2 22 - 32 mmol/L 22 26 25   Calcium 8.9 - 10.3 mg/dL 9.0 8.8(L) 9.4  Total Protein 6.5 - 8.1 g/dL 8.7(H) - 9.1(H)  Total Bilirubin 0.3 - 1.2 mg/dL 0.5 - 0.7  Alkaline Phos 38 - 126 U/L 56 - 62  AST 15 - 41 U/L  20 - 15  ALT 14 - 54 U/L 12(L) - 13(L)   Imaging studies:  CT Abdomen and Pelvis with IV Contrast (04/03/2017) - personally reviewed, compared to prior CT abdominal imaging, and discussed with patient Interim finding of small to moderate ascites within the abdomen and pelvis with elevated density values. New foci of pneumoperitoneum consistent with viscus perforation. Mildly rim enhancing fluid collection between the rectum and vagina is suspect for a developing abscess. This may be contiguous with a larger gas and fluid collection in the anterior pelvis, superior to the bladder.  The stomach is nonenlarged. No evidence for a bowel obstruction. Interim finding of thickened small bowel loops in the left upper quadrant and left lower quadrant. Wall thickening of the sigmoid colon with diverticular changes present.  Assessment/Plan: (ICD-10's: K10.20) 41 y.o. West with pneumoperitoneum and pelvic abscess attributable to perforated sigmoid colonic diverticulitis, complicated by pertinent comorbidities including obesity (BMI 36), chronic anemia, and tobacco abuse (smoking).   - pain control prn  - IV antibiotics (Zosyn)  - NPO, IVF, correct electrolytes (K+)   - all risks, benefits, and alternatives to partial colectomy with creation of end-colostomy,  abdominal washout, and drainage were discussed with the patient, all of her questions were answered to her expressed satisfaction, patient expresses she wishes to proceed, and informed consent was accordingly obtained.  - will plan for Hartmann's procedure tonight   - DVT prophylaxis  All of the above findings and recommendations were discussed with the patient, and all of patient's questions were answered to her expressed satisfaction.  Thank you for the opportunity to participate in this patient's care.   -- Marilynne Drivers Rosana Hoes, MD, [place]: [facility] - Partnering for exceptional care. Office: [phone]

## 2017-04-03 NOTE — Anesthesia Preprocedure Evaluation (Signed)
Anesthesia Evaluation  Patient identified by MRN, date of birth, ID band Patient awake    Reviewed: Allergy & Precautions, NPO status , Patient's Chart, lab work & pertinent test results  History of Anesthesia Complications Negative for: history of anesthetic complications  Airway Mallampati: III  TM Distance: >3 FB Neck ROM: Full    Dental  (+) Poor Dentition,    Pulmonary neg sleep apnea, neg COPD, Current Smoker,    breath sounds clear to auscultation- rhonchi (-) wheezing      Cardiovascular Exercise Tolerance: Good (-) hypertension(-) CAD, (-) Past MI and (-) Cardiac Stents  Rhythm:Regular Rate:Normal - Systolic murmurs and - Diastolic murmurs    Neuro/Psych negative neurological ROS  negative psych ROS   GI/Hepatic Neg liver ROS, Bowel perforation    Endo/Other  negative endocrine ROSneg diabetes  Renal/GU negative Renal ROS     Musculoskeletal negative musculoskeletal ROS (+)   Abdominal (+) + obese,   Peds  Hematology negative hematology ROS (+)   Anesthesia Other Findings Past Medical History: 01/12/2015: Abnormal uterine bleeding 04/18/2014: 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: Female infertility, 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/2015: Infertility, 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: Intramural leiomyoma of uterus 04/18/2014: Menometrorrhagia No date: Obesity (BMI 35.0-39.9 without comorbidity) 04/18/2014: Submucous leiomyoma of uterus 03/16/2017: Subserous leiomyoma of uterus   Reproductive/Obstetrics                             Anesthesia Physical Anesthesia Plan  ASA: II and emergent  Anesthesia Plan: General   Post-op Pain Management:    Induction: Intravenous, Rapid sequence and Cricoid pressure planned  PONV Risk Score and Plan: 1 and Ondansetron and Dexamethasone  Airway Management Planned: Oral ETT  Additional Equipment:   Intra-op Plan:   Post-operative Plan: Extubation  in OR  Informed Consent: I have reviewed the patients History and Physical, chart, labs and discussed the procedure including the risks, benefits and alternatives for the proposed anesthesia with the patient or authorized representative who has indicated his/her understanding and acceptance.   Dental advisory given  Plan Discussed with: CRNA and Anesthesiologist  Anesthesia Plan Comments:         Anesthesia Quick Evaluation

## 2017-04-03 NOTE — ED Notes (Signed)
Pt. Signed consent and changed into gown and jewerly removed.

## 2017-04-03 NOTE — ED Provider Notes (Signed)
[facility] Emergency Department Provider Note  ____________________________________________   None    (approximate)  I have reviewed the triage vital signs and the nursing notes.   HISTORY  Chief Complaint Abdominal Pain   HPI Dana West is a 41 y.o. female history of presents to the emergency department with severe sudden onset diffuse abdominal pain that began today. For the past several weeks she has been battling diverticulitis and is completed a round of IV antibiotics and is currently taking oral antibiotics. She had been recovering nicely and most most recently seen in clinic 2 days ago. Today she suddenly began to feel worse. She has been nauseated and vomiting brown material. Her pain is nonradiating. She's had no fevers or chills.   Past Medical History:  Diagnosis Date  . Abnormal uterine bleeding 01/12/2015  . 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 Intramural leiomyoma of uterus 01/11/2015  . Menometrorrhagia 04/18/2014  . Submucous leiomyoma of uterus 04/18/2014  . Subserous leiomyoma of uterus 03/16/2017    Patient Active Problem List   Diagnosis Date Noted  . Diverticulitis 03/20/2017  . 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 Abnormal uterine bleeding 01/12/2015  . 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 Intramural leiomyoma of uterus 01/11/2015  . 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 Menometrorrhagia 04/18/2014  . Submucous leiomyoma of uterus 04/18/2014    Past Surgical History:  Procedure Laterality Date  . ABDOMINAL HYSTERECTOMY    . UTERINE FIBROID SURGERY      Prior to Admission medications   Medication Sig Start Date End Date Taking? Authorizing Provider  amoxicillin-clavulanate (AUGMENTIN) 875-125 MG tablet Take 1 tablet by mouth 2 (two) times daily.  04/01/17 04/15/17  Clayburn Pert, MD  ibuprofen (ADVIL,MOTRIN) 600 MG tablet Take 600 mg by mouth every 6 (six) hours as needed.    [provider]  ondansetron (ZOFRAN) 4 MG tablet Take 1 tablet (4 mg total) by mouth every 8 (eight) hours as needed for nausea or vomiting. 02/23/17   Nance Pear, MD    Allergies Hydrocodone-acetaminophen; Genevive Bi; and Valium [diazepam]  Family History  Problem Relation Age of Onset  . Alcohol abuse Neg Hx   . Arthritis Neg Hx   . Asthma Neg Hx   . Birth defects Neg Hx   . Cancer Neg Hx   . COPD Neg Hx   . Depression Neg Hx   . Diabetes Neg Hx   . Drug abuse Neg Hx   . Early death Neg Hx   . Hearing loss Neg Hx   . Heart disease Neg Hx   . Hyperlipidemia Neg Hx   . Hypertension Neg Hx   . Kidney disease Neg Hx   . Learning disabilities Neg Hx   . Mental illness Neg Hx   . Mental retardation Neg Hx   . Miscarriages / Stillbirths Neg Hx   . Stroke Neg Hx   . Vision loss Neg Hx   . Varicose Veins Neg Hx     Social History Social History  Substance Use Topics  . Smoking status: Current Some Day Smoker    Packs/day: 0.25  . Smokeless tobacco: Never Used  . Alcohol use No     Comment: seldom    Review of Systems Constitutional: No fever/chills Eyes: No visual changes. ENT: No sore throat. Cardiovascular: Denies chest pain.  Respiratory: Denies shortness of breath. Gastrointestinal: positive for abdominal pain.  positive for nausea, positive for vomiting.  No diarrhea.  No constipation. Genitourinary: Negative for dysuria. Musculoskeletal: Negative for back pain. Skin: Negative for rash. Neurological: Negative for headaches, focal weakness or numbness.   ____________________________________________   PHYSICAL EXAM:  VITAL SIGNS: ED Triage Vitals  Enc Vitals Group     BP 04/03/17 2103 (!) 175/95     Pulse Rate 04/03/17 2103 (!) 134     Resp --      Temp 04/03/17 2103 98.8 F (37.1 C)     Temp Source  04/03/17 2103 Oral     SpO2 04/03/17 2103 98 %     Weight 04/03/17 2104 227 lb (103 kg)     Height 04/03/17 2104 5\' 7"  (1.702 m)     Head Circumference --      Peak Flow --      Pain Score --      Pain Loc --      Pain Edu? --      Excl. in [place]? --     Constitutional: alert and oriented 4 pacing around the room actively vomiting appears extremely uncomfortable Eyes: PERRL EOMI. Head: Atraumatic. Nose: No congestion/rhinnorhea. Mouth/Throat: No trismus Neck: No stridor.   Cardiovascular: tachycardic rate, regular rhythm. Grossly normal heart sounds.  Good peripheral circulation. Respiratory: increased respiratory effort.  No retractions. Lungs CTAB and moving good air Gastrointestinal: no frank peritonitis but extremely tender diffusely with no focality Musculoskeletal: No lower extremity edema   Neurologic:  Normal speech and language. No gross focal neurologic deficits are appreciated. Skin:  Skin is warm, dry and intact. No rash noted. Psychiatric:tearful and uncomfortable appearing    ____________________________________________   DIFFERENTIAL includes but not limited to  diverticulitis, abscess, fistula, perforation ____________________________________________   LABS (all labs ordered are listed, but only abnormal results are displayed)  Labs Reviewed  COMPREHENSIVE METABOLIC PANEL - Abnormal; Notable for the following:       Result Value   Potassium 2.9 (*)    Glucose, Bld 125 (*)    Total Protein 8.7 (*)    Albumin 3.0 (*)    ALT 12 (*)    All other components within normal limits  CBC - Abnormal; Notable for the following:    Platelets 529 (*)    All other components within normal limits  CULTURE, BLOOD (ROUTINE X 2)  CULTURE, BLOOD (ROUTINE X 2)  LIPASE, BLOOD  URINALYSIS, COMPLETE (UACMP) WITH MICROSCOPIC  LACTIC ACID, PLASMA  LACTIC ACID, PLASMA  TYPE AND SCREEN    blood work reviewed and interpreted by me shows elevated platelets likely secondary  to stress response and low potassium likely secondary to stress response __________________________________________  EKG   ____________________________________________  RADIOLOGY  CT scan abdomen pelvis reviewed by me shows bowel perforation with fluid in the pelvis ____________________________________________   PROCEDURES  Procedure(s) performed: no  Procedures  Critical Care performed: yes  CRITICAL CARE Performed by: Darel Hong   Total critical care time: 35 minutes  Critical care time was exclusive of separately billable procedures and treating other patients.  Critical care was necessary to treat or prevent imminent or life-threatening deterioration.  Critical care was time spent personally by me on the following activities: development of treatment plan with patient and/or surrogate as well as nursing, discussions with consultants, evaluation of patient's response to treatment, examination of patient, obtaining history from patient or surrogate, ordering and performing treatments and interventions, ordering  and review of laboratory studies, ordering and review of radiographic studies, pulse oximetry and re-evaluation of patient's condition.   Observation: no ____________________________________________   INITIAL IMPRESSION / ASSESSMENT AND PLAN / ED COURSE  Pertinent labs & imaging results that were available during my care of the patient were reviewed by me and considered in my medical decision making (see chart for details).  The patient arrives tachycardic and uncomfortable appearing with a extremely tender abdomen. Concern is for recurrent abscess versus perforation. I will reach out to general surgery to discuss the case.      ----------------------------------------- 9:43 PM on 04/03/2017 -----------------------------------------  I discussed the case with on-call general surgeon Dr. Rosana Hoes who agrees with a CT scan with IV contrast and he'll kindly  consult on the patient. ______________________________________   ----------------------------------------- 10:38 PM on 04/03/2017 -----------------------------------------  I was called by radiology that the patient's CT scan shows a perforation. I then discussed the case with Dr. Rosana Hoes who will come down and evaluate the patient now but she will require inpatient admission for surgical correction of her perforated bowel this evening.  the patient remains nauseated in a significant amount of pain so I will give her another dose of morphine and Zofran now.  FINAL CLINICAL IMPRESSION(S) / ED DIAGNOSES  Final diagnoses:  Bowel perforation ([place])      NEW MEDICATIONS STARTED DURING THIS VISIT:  New Prescriptions   No medications on file     Note:  This document was prepared using Dragon voice recognition software and may include unintentional dictation errors.     Darel Hong, MD 04/03/17 2242

## 2017-04-03 NOTE — ED Notes (Signed)
Pt. States ongoing issues with diverticulitis.  Pt. States pain started today around 3 pm and has not improved.

## 2017-04-03 NOTE — H&P (Signed)
SURGICAL ADMISSION HISTORY & PHYSICAL  HISTORY OF PRESENT ILLNESS (HPI):  41 y.o. obese female well-known to our service for recent prolonged history of sigmoid diverticulitis with intramural and pelvic abscesses, treated with antibiotics and last seen in surgery office 2 days ago, presented to [facility] ED today for evaluation of acute worsening of abdominal pain. Patient reports her "usual" lower abdominal pain suddenly became worse ~ 3 pm this afternoon, along with nausea and followed by non-bloody emesis. Patient describes she regularly experiences chills, which she attributes to chronic anemia, and she states the abdominal pain makes it difficult to take deep breaths, but her breathing improves with pain control and she denies chest pain or tightness. Patient's last BM, less loose "than usual" was this morning, and she says she has not eaten anything since early this morning and feels thirsty. Of note, she also says she has only smoked one cigarette since being discharged from [facility] on 10/9.  Surgery was consulted by ED physician Dr. Mable Paris in this context for evaluation and management of perforated sigmoid diverticulitis.  PAST MEDICAL HISTORY (PMH):  Past Medical History:  Diagnosis Date  . Abnormal uterine bleeding 01/12/2015  . 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 Intramural leiomyoma of uterus 01/11/2015  . Menometrorrhagia 04/18/2014  . Obesity (BMI 35.0-39.9 without comorbidity)   . Submucous leiomyoma of uterus 04/18/2014  . Subserous leiomyoma of uterus 03/16/2017     PAST SURGICAL HISTORY [facility]):  Past Surgical History:  Procedure Laterality Date  . ABDOMINAL HYSTERECTOMY  2017  . UTERINE FIBROID SURGERY       MEDICATIONS:  Prior to Admission medications   Medication Sig Start Date End Date Taking? Authorizing Provider  amoxicillin-clavulanate (AUGMENTIN) 875-125 MG tablet Take 1  tablet by mouth 2 (two) times daily. 04/01/17 04/15/17  Clayburn Pert, MD  ibuprofen (ADVIL,MOTRIN) 600 MG tablet Take 600 mg by mouth every 6 (six) hours as needed.    [provider]  ondansetron (ZOFRAN) 4 MG tablet Take 1 tablet (4 mg total) by mouth every 8 (eight) hours as needed for nausea or vomiting. 02/23/17   Nance Pear, MD     ALLERGIES:  Allergies  Allergen Reactions  . Hydrocodone-Acetaminophen Hives  . Raspberry Swelling and Nausea And Vomiting  . Valium [Diazepam] Hives     SOCIAL HISTORY:  Social History   Social History  . Marital status: Single    Spouse name: N/A  . Number of children: N/A  . Years of education: N/A   Occupational History  . Not on file.   Social History Main Topics  . Smoking status: Current Some Day Smoker    Packs/day: 0.25  . Smokeless tobacco: Never Used  . Alcohol use No     Comment: seldom  . Drug use: No  . Sexual activity: No   Other Topics Concern  . Not on file   Social History Narrative  . No narrative on file    The patient currently resides (home / rehab facility / nursing home): Home The patient normally is (ambulatory / bedbound): Ambulatory   FAMILY HISTORY:  Family History  Problem Relation Age of Onset  . Alcohol abuse Neg Hx   . Arthritis Neg Hx   . Asthma Neg Hx   . Birth defects Neg Hx   . Cancer Neg Hx   . COPD Neg Hx   . Depression Neg Hx   .  Diabetes Neg Hx   . Drug abuse Neg Hx   . Early death Neg Hx   . Hearing loss Neg Hx   . Heart disease Neg Hx   . Hyperlipidemia Neg Hx   . Hypertension Neg Hx   . Kidney disease Neg Hx   . Learning disabilities Neg Hx   . Mental illness Neg Hx   . Mental retardation Neg Hx   . Miscarriages / Stillbirths Neg Hx   . Stroke Neg Hx   . Vision loss Neg Hx   . Varicose Veins Neg Hx      REVIEW OF SYSTEMS:  Constitutional: denies weight loss, fever, chills, or sweats  Eyes: denies any other vision changes, history of eye injury  ENT:  denies sore throat, hearing problems  Respiratory: denies shortness of breath, wheezing  Cardiovascular: denies chest pain, palpitations  Gastrointestinal: abdominal pain, N/V, and bowel function as per HPI Genitourinary: denies burning with urination or urinary frequency Musculoskeletal: denies any other joint pains or cramps  Skin: denies any other rashes or skin discolorations  Neurological: denies any other headache, dizziness, weakness  Psychiatric: denies any other depression, anxiety   All other review of systems were negative   VITAL SIGNS:  Temp:  [98.8 F (37.1 C)] 98.8 F (37.1 C) (10/19 2103) Pulse Rate:  [107-134] 107 (10/19 2225) Resp:  [16] 16 (10/19 2225) BP: (175)/(95) 175/95 (10/19 2103) SpO2:  [97 %-98 %] 97 % (10/19 2225) Weight:  [227 lb (103 kg)] 227 lb (103 kg) (10/19 2104)     Height: 5\' 7"  (170.2 cm) Weight: 227 lb (103 kg) BMI (Calculated): 35.55   INTAKE/OUTPUT:  This shift: No intake/output data recorded.  Last 2 shifts: @IOLAST2SHIFTS @   PHYSICAL EXAM:  Constitutional:  -- Obese body habitus  -- Awake, alert, and oriented x3  Eyes:  -- Pupils equally round and reactive to light  -- No scleral icterus  Ear, nose, and throat:  -- No jugular venous distension  Pulmonary:  -- No crackles  -- Equal breath sounds bilaterally -- Breathing non-labored at rest Cardiovascular:  -- S1, S2 present  -- No pericardial rubs Gastrointestinal:  -- Abdomen soft, obese, and non-distended with moderate diffuse abdominal pain, no guarding or rebound tenderness -- No abdominal masses appreciated, pulsatile or otherwise  Musculoskeletal and Integumentary:  -- Wounds or skin discoloration: None appreciated -- Extremities: B/L UE and LE FROM, hands and feet warm  Neurologic:  -- Motor function: intact and symmetric -- Sensation: intact and symmetric  Labs:  CBC Latest Ref Rng & Units 04/03/2017 03/24/2017 03/21/2017  WBC 3.6 - 11.0 K/uL 9.6 8.2 7.9  Hemoglobin  12.0 - 16.0 g/dL 13.2 10.1(L) 10.7(L)  Hematocrit 35.0 - 47.0 % 40.3 29.5(L) 31.5(L)  Platelets 150 - 440 K/uL 529(H) 266 246   CMP Latest Ref Rng & Units 04/03/2017 03/21/2017 03/20/2017  Glucose 65 - 99 mg/dL 125(H) 87 97  BUN 6 - 20 mg/dL 11 8 11   Creatinine 0.44 - 1.00 mg/dL 0.87 0.92 0.74  Sodium 135 - 145 mmol/L 138 140 137  Potassium 3.5 - 5.1 mmol/L 2.9(L) 3.5 3.7  Chloride 101 - 111 mmol/L 104 108 103  CO2 22 - 32 mmol/L 22 26 25   Calcium 8.9 - 10.3 mg/dL 9.0 8.8(L) 9.4  Total Protein 6.5 - 8.1 g/dL 8.7(H) - 9.1(H)  Total Bilirubin 0.3 - 1.2 mg/dL 0.5 - 0.7  Alkaline Phos 38 - 126 U/L 56 - 62  AST 15 - 41 U/L  20 - 15  ALT 14 - 54 U/L 12(L) - 13(L)   Imaging studies:  CT Abdomen and Pelvis with IV Contrast (04/03/2017) - personally reviewed, compared to prior CT abdominal imaging, and discussed with patient Interim finding of small to moderate ascites within the abdomen and pelvis with elevated density values. New foci of pneumoperitoneum consistent with viscus perforation. Mildly rim enhancing fluid collection between the rectum and vagina is suspect for a developing abscess. This may be contiguous with a larger gas and fluid collection in the anterior pelvis, superior to the bladder.  The stomach is nonenlarged. No evidence for a bowel obstruction. Interim finding of thickened small bowel loops in the left upper quadrant and left lower quadrant. Wall thickening of the sigmoid colon with diverticular changes present.  Assessment/Plan: (ICD-10's: K42.20) 41 y.o. female with pneumoperitoneum and pelvic abscess attributable to perforated sigmoid colonic diverticulitis, complicated by pertinent comorbidities including obesity (BMI 36), chronic anemia, and tobacco abuse (smoking).   - pain control prn  - IV antibiotics (Zosyn)  - NPO, IVF, correct electrolytes (K+)   - all risks, benefits, and alternatives to partial colectomy with creation of end-colostomy, abdominal  washout, and drainage were discussed with the patient, all of her questions were answered to her expressed satisfaction, patient expresses she wishes to proceed, and informed consent was accordingly obtained.  - will plan for Hartmann's procedure tonight   - DVT prophylaxis  All of the above findings and recommendations were discussed with the patient, and all of patient's questions were answered to her expressed satisfaction.  -- Marilynne Drivers Rosana Hoes, MD, [place]: [facility] - Partnering for exceptional care. Office: [phone]

## 2017-04-03 NOTE — ED Triage Notes (Signed)
Pt reports that a few weeks ago she was diagnosised  Diverticulitis. She began getting cramps and thinks that she may be throwing up stool today.

## 2017-04-03 NOTE — Progress Notes (Signed)
ANTIBIOTIC CONSULT NOTE - INITIAL  Pharmacy Consult for Zosyn  Indication: intra abdominal  Allergies  Allergen Reactions  . Hydrocodone-Acetaminophen Hives  . Raspberry Swelling and Nausea And Vomiting  . Valium [Diazepam] Hives    Patient Measurements: Height: 5\' 7"  (170.2 cm) Weight: 227 lb (103 kg) IBW/kg (Calculated) : 61.6 Adjusted Body Weight:   Vital Signs: Temp: 98.8 F (37.1 C) (10/19 2103) Temp Source: Oral (10/19 2103) BP: 175/95 (10/19 2103) Pulse Rate: 107 (10/19 2225) Intake/Output from previous day: No intake/output data recorded. Intake/Output from this shift: No intake/output data recorded.  Labs:  Recent Labs  04/03/17 2107  WBC 9.6  HGB 13.2  PLT 529*  CREATININE 0.87   Estimated Creatinine Clearance: 102.9 mL/min (by C-G formula based on SCr of 0.87 mg/dL). No results for input(s): VANCOTROUGH, VANCOPEAK, VANCORANDOM, GENTTROUGH, GENTPEAK, GENTRANDOM, TOBRATROUGH, TOBRAPEAK, TOBRARND, AMIKACINPEAK, AMIKACINTROU, AMIKACIN in the last 72 hours.   Microbiology: Recent Results (from the past 720 hour(s))  Blood Culture (routine x 2)     Status: None   Collection Time: 03/20/17  2:40 PM  Result Value Ref Range Status   Specimen Description BLOOD RIGHT ANTECUBITAL  Final   Special Requests   Final    BOTTLES DRAWN AEROBIC AND ANAEROBIC Blood Culture adequate volume   Culture NO GROWTH 5 DAYS  Final   Report Status 03/25/2017 FINAL  Final  Blood Culture (routine x 2)     Status: None   Collection Time: 03/20/17  2:40 PM  Result Value Ref Range Status   Specimen Description BLOOD BLOOD RIGHT HAND  Final   Special Requests   Final    BOTTLES DRAWN AEROBIC AND ANAEROBIC Blood Culture adequate volume   Culture NO GROWTH 5 DAYS  Final   Report Status 03/25/2017 FINAL  Final    Medical History: Past Medical History:  Diagnosis Date  . Abnormal uterine bleeding 01/12/2015  . 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 Female infertility,  age related 04/18/2014  . 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 Intramural leiomyoma of uterus 01/11/2015  . Menometrorrhagia 04/18/2014  . Obesity (BMI 35.0-39.9 without comorbidity)   . Submucous leiomyoma of uterus 04/18/2014  . Subserous leiomyoma of uterus 03/16/2017    Medications:   (Not in a hospital admission) Assessment: CrCl = 102.9 ml/min   Goal of Therapy:  resolution of infection  Plan:  Expected duration 7 days with resolution of temperature and/or normalization of WBC   Zosyn 3.375 gm IV X 1 given on 10/19 @ 23:00 Zosyn 3.375 gm IV Q8H EI ordered to start on 10/20 @ 0500.   Dana West D 04/03/2017,11:26 PM

## 2017-04-03 NOTE — ED Notes (Signed)
Report given to OR nurse on floor.

## 2017-04-04 DIAGNOSIS — Z888 Allergy status to other drugs, medicaments and biological substances status: Secondary | ICD-10-CM | POA: Diagnosis not present

## 2017-04-04 DIAGNOSIS — Z79899 Other long term (current) drug therapy: Secondary | ICD-10-CM | POA: Diagnosis not present

## 2017-04-04 DIAGNOSIS — K5720 Diverticulitis of large intestine with perforation and abscess without bleeding: Secondary | ICD-10-CM | POA: Diagnosis present

## 2017-04-04 DIAGNOSIS — Z91018 Allergy to other foods: Secondary | ICD-10-CM | POA: Diagnosis not present

## 2017-04-04 DIAGNOSIS — R188 Other ascites: Secondary | ICD-10-CM | POA: Diagnosis present

## 2017-04-04 DIAGNOSIS — Z6836 Body mass index (BMI) 36.0-36.9, adult: Secondary | ICD-10-CM | POA: Diagnosis not present

## 2017-04-04 DIAGNOSIS — D649 Anemia, unspecified: Secondary | ICD-10-CM | POA: Diagnosis present

## 2017-04-04 DIAGNOSIS — R109 Unspecified abdominal pain: Secondary | ICD-10-CM | POA: Diagnosis present

## 2017-04-04 DIAGNOSIS — Z885 Allergy status to narcotic agent status: Secondary | ICD-10-CM | POA: Diagnosis not present

## 2017-04-04 DIAGNOSIS — E669 Obesity, unspecified: Secondary | ICD-10-CM | POA: Diagnosis present

## 2017-04-04 DIAGNOSIS — K631 Perforation of intestine (nontraumatic): Secondary | ICD-10-CM | POA: Diagnosis not present

## 2017-04-04 DIAGNOSIS — F17210 Nicotine dependence, cigarettes, uncomplicated: Secondary | ICD-10-CM | POA: Diagnosis present

## 2017-04-04 DIAGNOSIS — K567 Ileus, unspecified: Secondary | ICD-10-CM | POA: Diagnosis not present

## 2017-04-04 DIAGNOSIS — K668 Other specified disorders of peritoneum: Secondary | ICD-10-CM | POA: Diagnosis present

## 2017-04-04 DIAGNOSIS — Z90710 Acquired absence of both cervix and uterus: Secondary | ICD-10-CM | POA: Diagnosis not present

## 2017-04-04 DIAGNOSIS — N736 Female pelvic peritoneal adhesions (postinfective): Secondary | ICD-10-CM | POA: Diagnosis present

## 2017-04-04 HISTORY — DX: Diverticulitis of large intestine with perforation and abscess without bleeding: K57.20

## 2017-04-04 MED ORDER — ONDANSETRON HCL 4 MG/2ML IJ SOLN
INTRAMUSCULAR | Status: DC | PRN
Start: 2017-04-04 — End: 2017-04-04
  Administered 2017-04-04: 4 mg via INTRAVENOUS

## 2017-04-04 MED ORDER — HYDROMORPHONE HCL 1 MG/ML IJ SOLN
INTRAMUSCULAR | Status: AC
  Filled 2017-04-04: qty 1

## 2017-04-04 MED ORDER — KETOROLAC TROMETHAMINE 30 MG/ML IJ SOLN
30.0000 mg | Freq: Four times a day (QID) | INTRAMUSCULAR | Status: AC
  Administered 2017-04-04 – 2017-04-09 (×19): 30 mg via INTRAVENOUS
  Filled 2017-04-04 (×19): qty 1

## 2017-04-04 MED ORDER — ROCURONIUM BROMIDE 100 MG/10ML IV SOLN
INTRAVENOUS | Status: DC | PRN
  Administered 2017-04-03: 30 mg via INTRAVENOUS
  Administered 2017-04-04: 10 mg via INTRAVENOUS
  Administered 2017-04-04 (×2): 20 mg via INTRAVENOUS

## 2017-04-04 MED ORDER — BUPIVACAINE LIPOSOME 1.3 % IJ SUSP
INTRAMUSCULAR | Status: DC | PRN
  Administered 2017-04-04: 20 mL

## 2017-04-04 MED ORDER — PIPERACILLIN-TAZOBACTAM 3.375 G IVPB
3.3750 g | Freq: Once | INTRAVENOUS | Status: AC
  Filled 2017-04-04: qty 50

## 2017-04-04 MED ORDER — HYDROMORPHONE HCL 1 MG/ML IJ SOLN
INTRAMUSCULAR | Status: DC | PRN
  Administered 2017-04-04 (×2): 0.5 mg via INTRAVENOUS

## 2017-04-04 MED ORDER — FENTANYL CITRATE (PF) 100 MCG/2ML IJ SOLN: 25.0000 ug | INTRAMUSCULAR | Status: DC | PRN

## 2017-04-04 MED ORDER — DEXAMETHASONE SODIUM PHOSPHATE 10 MG/ML IJ SOLN
INTRAMUSCULAR | Status: DC | PRN
  Administered 2017-04-04: 10 mg via INTRAVENOUS

## 2017-04-04 MED ORDER — PROMETHAZINE HCL 25 MG/ML IJ SOLN: 6.2500 mg | INTRAMUSCULAR | Status: DC | PRN

## 2017-04-04 MED ORDER — ENOXAPARIN SODIUM 40 MG/0.4ML ~~LOC~~ SOLN
40.0000 mg | SUBCUTANEOUS | Status: DC
  Administered 2017-04-05 – 2017-04-09 (×5): 40 mg via SUBCUTANEOUS
  Filled 2017-04-04 (×5): qty 0.4

## 2017-04-04 MED ORDER — HYDROMORPHONE HCL 1 MG/ML IJ SOLN: 0.2500 mg | INTRAMUSCULAR | Status: DC | PRN

## 2017-04-04 MED ORDER — HYDROMORPHONE HCL 1 MG/ML IJ SOLN
0.5000 mg | INTRAMUSCULAR | Status: DC | PRN
  Administered 2017-04-04 – 2017-04-06 (×8): 0.5 mg via INTRAVENOUS
  Filled 2017-04-04 (×11): qty 0.5

## 2017-04-04 MED ORDER — SUGAMMADEX SODIUM 200 MG/2ML IV SOLN
INTRAVENOUS | Status: DC | PRN
  Administered 2017-04-04: 250 mg via INTRAVENOUS

## 2017-04-04 MED ORDER — ROCURONIUM BROMIDE 50 MG/5ML IV SOLN
INTRAVENOUS | Status: AC
  Filled 2017-04-04: qty 1

## 2017-04-04 MED ORDER — POTASSIUM CHLORIDE 2 MEQ/ML IV SOLN
INTRAVENOUS | Status: DC
  Administered 2017-04-04 – 2017-04-05 (×3): via INTRAVENOUS
  Filled 2017-04-04 (×7): qty 1000

## 2017-04-04 MED ORDER — MEPERIDINE HCL 50 MG/ML IJ SOLN: 6.2500 mg | INTRAMUSCULAR | Status: DC | PRN

## 2017-04-04 MED ORDER — SUGAMMADEX SODIUM 500 MG/5ML IV SOLN
INTRAVENOUS | Status: AC
  Filled 2017-04-04: qty 5

## 2017-04-04 MED ORDER — BUPIVACAINE-EPINEPHRINE 0.25% -1:200000 IJ SOLN
INTRAMUSCULAR | Status: DC | PRN
  Administered 2017-04-04: 30 mL

## 2017-04-04 MED ORDER — PHENYLEPHRINE HCL 10 MG/ML IJ SOLN
INTRAMUSCULAR | Status: DC | PRN
  Administered 2017-04-03: 100 ug via INTRAVENOUS
  Administered 2017-04-04 (×2): 200 ug via INTRAVENOUS

## 2017-04-04 NOTE — Op Note (Addendum)
SURGICAL OPERATIVE REPORT  DATE OF PROCEDURE: 04/04/2017  ATTENDING: Surgeon(s): Vickie Epley, MD  ANESTHESIA: GETA  PRE-OPERATIVE DIAGNOSIS: Acutely perforated chronic sigmoid colonic diverticulitis with pneumoperitoneum (K57.20)  POST-OPERATIVE DIAGNOSIS: Acutely perforated chronic sigmoid colonic diverticulitis with pneumoperitoneum and purulent peritonitis (K57.20)  PROCEDURE(S): (cpt's: 40981) 1.) Partial (sigmoid) colectomy with closed rectal stump and creation of end colostomy (Hartmann's procedure)  INTRAOPERATIVE FINDINGS: Large volume purulent ascites upon entering the peritoneal cavity, severe pelvic inflammation and dense inflammatory adhesions, very short colonic mesentery, thick abdominal wall, hemostasis confirmed and pink viable end-colostomy  INTRAVENOUS FLUIDS: 3000 mL crystalloid   ESTIMATED BLOOD LOSS: 100 mL   URINE OUTPUT: 300 mL   SPECIMENS: Sigmoid colon   IMPLANTS: None  DRAINS: Two 62F Blake drains were placed with the LLQ drain along the Left paracolic gutter and into the pelvis and the RLQ drain along the Right and upper abdomen, where pus was also encountered.  COMPLICATIONS: None apparent   CONDITION AT END OF PROCEDURE: Hemodynamically stable and extubated   DISPOSITION OF PATIENT: PACU  INDICATIONS FOR PROCEDURE:  Patient is a 41 y.o. obese female well-known to our service for recent prolonged history of sigmoid diverticulitis with intramural and pelvic abscesses, treated with antibiotics and last seen in surgery office 2 days ago, presented to [facility] ED today for evaluation of acute worsening of abdominal pain x 8 hours with non-bloody emesis. CT imaging confirmed perforated sigmoid colonic diverticulitis with enlarged pelvic abscess. All risks, benefits, and alternatives to above procedure were discussed with the patient, all of patient's questions were answered to her expressed satisfaction, and informed consent was obtained and  documented.  DETAILS OF PROCEDURE: Patient was brought to the operating suite and appropriately identified. General anesthesia was administered along with appropriate pre-operative antibiotics, and endotracheal intubation was performed by anesthetist. In supine position, operative site was prepped and draped in the usual sterile fashion, and following a brief time out, a lower vertical midline incision was made from the umbilicus inferiorly to just above the pubis using a #10 blade scalpel and extended deep through subcutaneous tissues until fascia was visualized and exposed along the linea alba midline between the rectus abdominal muscles. Lower midline fascia was then divided in the midline. Upon dissecting through preperitoneal fat, the peritoneum was entered and opened along the length of the incision, taking care to avoid injury to underlying bowel, and a large volume of purulent fluid was encountered and fluid specimen was obtained for culture.  Abdominal cavity was explored, and the small bowel was retracted to the superiorly and to the Right, enabling placement of large wound protector. The descending colon was then retracted medially using a moist towel. Using a combination of blunt dissection and electrocautery, the colon was freed from its lateral peritoneal attachments along the white line of Toldt distally approaching the rectosigmoid junction and proximally from the splenic flexure. In the pelvic, rock-hard dense inflammatory adhesions were encountered and lysed, along with additional purulent fluid, which was suctioned. During the dissection, care was taken to avoid injury to the ureters. Points of transection were selected distally and proximally, and the proximal bowel was stapled and divided using a GIA-75 linear cutting stapler, while the distal colon was stapled and divided using a Contour curvilinear cutting stapler. Peritoneum was then scored along the mesentary with electrocautery, and  the vessels were cauterized, sealed, and cut using the Ligasure. Colon specimen was then handed off of the field as specimen for pathology. Additional colon length proximally to  nearly the splenic flexure was needed to be obtained due both to patient's very short mesentery and her rather thick abdominal wall.  The abdominal cavity was copiously irrigated, and hemostasis was once more confirmed. Two 47F Blake drains were placed with the LLQ drain along the Left paracolic gutter and into the pelvis and the RLQ drain along the Right and upper abdomen, where pus was also encountered. A LUQ colostomy site was selected away from the vertical lower midline laparotomy incision, and end-colostomy was ultimately confirmed to traverse the patient's thick abdominal wall without tension or twisting. Exparel (72-hour release liposomal formulation of bupivicane) was injected into fascia, and the abdominal wall was re-approximated in layers with #1 looped PDS sutures from the top and bottom of the incision and surgical skin staples to re-approximate skin after subcutaneous injection of the remainder of Exparel. Skin was then cleaned and dried, and a sterile honeycomb occlusive dressing was applied. Ostomy appliance was likewise measured, trimmed, and applied. Patient was then safely able to be awakened, extubated, and transferred to PACU for post-operative monitoring and care.  I was present for all aspects of the above procedure, and there were no complications apparent.

## 2017-04-04 NOTE — Progress Notes (Signed)
POD # 0 VSS  PE NAD Abd: soft, colotomy in place. dresing intact  A/P DC NGT Mobilize Labs in am

## 2017-04-04 NOTE — Anesthesia Post-op Follow-up Note (Signed)
Anesthesia QCDR form completed.        

## 2017-04-04 NOTE — Anesthesia Procedure Notes (Signed)
Procedure Name: Intubation Date/Time: 04/04/2017 11:45 PM Performed by: Johnna Acosta Pre-anesthesia Checklist: Patient identified, Emergency Drugs available, Suction available, Patient being monitored and Timeout performed Patient Re-evaluated:Patient Re-evaluated prior to induction Oxygen Delivery Method: Circle system utilized Preoxygenation: Pre-oxygenation with 100% oxygen Induction Type: IV induction, Rapid sequence and Cricoid Pressure applied Ventilation: Mask ventilation without difficulty Laryngoscope Size: McGraph and 3 Grade View: Grade I Tube type: Oral Tube size: 7.5 mm Number of attempts: 1 Airway Equipment and Method: Stylet and Video-laryngoscopy Placement Confirmation: ETT inserted through vocal cords under direct vision,  positive ETCO2 and breath sounds checked- equal and bilateral Secured at: 22 cm Tube secured with: Tape Dental Injury: Teeth and Oropharynx as per pre-operative assessment  Difficulty Due To: Difficulty was anticipated, Difficult Airway- due to large tongue and Difficult Airway- due to dentition Future Recommendations: Recommend- induction with short-acting agent, and alternative techniques readily available

## 2017-04-04 NOTE — Transfer of Care (Signed)
Immediate Anesthesia Transfer of Care Note  Patient: Dana West  Procedure(s) Performed: SIGMOID COLECTOMY WITH COLOSTOMY CREATION/HARTMANN PROCEDURE (N/A Abdomen)  Patient Location: PACU  Anesthesia Type:General  Level of Consciousness: sedated  Airway & Oxygen Therapy: Patient Spontanous Breathing and Patient connected to face mask oxygen  Post-op Assessment: Report given to RN and Post -op Vital signs reviewed and stable  Post vital signs: Reviewed and stable  Last Vitals:  Vitals:   04/03/17 2225 04/04/17 0423  BP:  138/70  Pulse: (!) 107 98  Resp: 16 17  Temp:  36.5 C  SpO2: 97% 100%    Last Pain:  Vitals:   04/04/17 0423  TempSrc: Temporal  PainSc:          Complications: No apparent anesthesia complications

## 2017-04-04 NOTE — Anesthesia Postprocedure Evaluation (Signed)
Anesthesia Post Note  Patient: Dana West  Procedure(s) Performed: SIGMOID COLECTOMY WITH COLOSTOMY CREATION/HARTMANN PROCEDURE (N/A Abdomen)  Patient location during evaluation: PACU Anesthesia Type: General Level of consciousness: awake and alert and oriented Pain management: pain level controlled Vital Signs Assessment: post-procedure vital signs reviewed and stable Respiratory status: spontaneous breathing, nonlabored ventilation and respiratory function stable Cardiovascular status: blood pressure returned to baseline and stable Postop Assessment: no signs of nausea or vomiting Anesthetic complications: no     Last Vitals:  Vitals:   04/04/17 0530 04/04/17 0625  BP:  128/67  Pulse: 98 95  Resp: 19 20  Temp:  36.6 C  SpO2: 94% 95%    Last Pain:  Vitals:   04/04/17 0630  TempSrc:   PainSc: Asleep                 Tariya Morrissette

## 2017-04-05 ENCOUNTER — Encounter: Payer: Self-pay | Admitting: Surgery

## 2017-04-05 LAB — BASIC METABOLIC PANEL
Anion gap: 10 (ref 5–15)
BUN: 15 mg/dL (ref 6–20)
CO2: 23 mmol/L (ref 22–32)
Calcium: 8.1 mg/dL — ABNORMAL LOW (ref 8.9–10.3)
Chloride: 101 mmol/L (ref 101–111)
Creatinine, Ser: 0.91 mg/dL (ref 0.44–1.00)
GFR calc Af Amer: >60 mL/min (ref >60)
GFR calc non Af Amer: >60 mL/min (ref >60)
Glucose, Bld: 95 mg/dL (ref 65–99)
Potassium: 3.5 mmol/L (ref 3.5–5.1)
Sodium: 134 mmol/L — ABNORMAL LOW (ref 135–145)

## 2017-04-05 LAB — CBC
HCT: 31.7 % — ABNORMAL LOW (ref 35.0–47.0)
Hemoglobin: 10.4 g/dL — ABNORMAL LOW (ref 12.0–16.0)
MCH: 28.7 pg (ref 26.0–34.0)
MCHC: 32.7 g/dL (ref 32.0–36.0)
MCV: 87.9 fL (ref 80.0–100.0)
Platelets: 341 10*3/uL (ref 150–440)
RBC: 3.61 MIL/uL — ABNORMAL LOW (ref 3.80–5.20)
RDW: 15.1 % — ABNORMAL HIGH (ref 11.5–14.5)
WBC: 16.5 10*3/uL — ABNORMAL HIGH (ref 3.6–11.0)

## 2017-04-05 MED ORDER — OXYCODONE-ACETAMINOPHEN 5-325 MG PO TABS
1.0000 | ORAL_TABLET | ORAL | Status: DC | PRN
  Administered 2017-04-05 (×2): 1 via ORAL
  Administered 2017-04-06 – 2017-04-07 (×7): 2 via ORAL
  Administered 2017-04-08: 1 via ORAL
  Administered 2017-04-09: 2 via ORAL
  Filled 2017-04-05 (×9): qty 2
  Filled 2017-04-05 (×3): qty 1

## 2017-04-05 MED ORDER — LACTATED RINGERS IV SOLN
INTRAVENOUS | Status: DC
  Administered 2017-04-05 – 2017-04-06 (×2): via INTRAVENOUS

## 2017-04-05 MED ORDER — ALUM & MAG HYDROXIDE-SIMETH 200-200-20 MG/5ML PO SUSP
30.0000 mL | Freq: Four times a day (QID) | ORAL | Status: DC | PRN
  Administered 2017-04-05: 30 mL via ORAL
  Filled 2017-04-05: qty 30

## 2017-04-05 MED ORDER — PANTOPRAZOLE SODIUM 40 MG IV SOLR
40.0000 mg | INTRAVENOUS | Status: DC
  Administered 2017-04-05 – 2017-04-07 (×3): 40 mg via INTRAVENOUS
  Filled 2017-04-05 (×3): qty 40

## 2017-04-05 NOTE — Progress Notes (Signed)
Hgb 10.4 Dr. Rosana Hoes aware. Patient experiencing high pain levels in beginning of shift. Appearing tense and stating it was painful to breath. Dilaudid 0.5mg  given every 3hrs to maintain comfort and decrease pain level. Patient reports the Dilaudid has helped her pain and allowed her to sleep more. Encouraged patient to ask for pain medicine when needed.

## 2017-04-05 NOTE — Progress Notes (Signed)
POD # 1 Doing ok Some pain AVSS Taking water No N/V  PE NAD Abd: dressing intact, JP serous. No peritonitis. Ostomy viable.  A/p Doing well Continue A/B Mobilize Clears

## 2017-04-06 LAB — BASIC METABOLIC PANEL
ANION GAP: 8 (ref 5–15)
BUN: 13 mg/dL (ref 6–20)
CHLORIDE: 102 mmol/L (ref 101–111)
CO2: 24 mmol/L (ref 22–32)
CREATININE: 0.83 mg/dL (ref 0.44–1.00)
Calcium: 7.9 mg/dL — ABNORMAL LOW (ref 8.9–10.3)
GFR calc Af Amer: >60 mL/min (ref >60)
GFR calc non Af Amer: >60 mL/min (ref >60)
GLUCOSE: 80 mg/dL (ref 65–99)
POTASSIUM: 3.3 mmol/L — AB (ref 3.5–5.1)
Sodium: 134 mmol/L — ABNORMAL LOW (ref 135–145)

## 2017-04-06 LAB — CBC
HEMATOCRIT: 28.3 % — AB (ref 35.0–47.0)
HEMOGLOBIN: 9.3 g/dL — AB (ref 12.0–16.0)
MCH: 28.8 pg (ref 26.0–34.0)
MCHC: 33 g/dL (ref 32.0–36.0)
MCV: 87.2 fL (ref 80.0–100.0)
PLATELETS: 315 10*3/uL (ref 150–440)
RBC: 3.25 MIL/uL — AB (ref 3.80–5.20)
RDW: 14.4 % (ref 11.5–14.5)
WBC: 17.8 10*3/uL — AB (ref 3.6–11.0)

## 2017-04-06 MED ORDER — KCL IN DEXTROSE-NACL 20-5-0.45 MEQ/L-%-% IV SOLN
INTRAVENOUS | Status: DC
  Administered 2017-04-06 – 2017-04-09 (×5): via INTRAVENOUS
  Filled 2017-04-06 (×6): qty 1000

## 2017-04-06 NOTE — Progress Notes (Signed)
[place] Hospital Day(s): 2.   Post op day(s): 3 Days Post-Op.   Interval History: Patient seen and examined, no acute events or new complaints overnight. Patient reports slowly improving pain and small amount of gas and pink non-stool liquid from her colostomy, denies N/V, fever/chills, CP, or SOB and proudly declares she has started ambulating in the halls.  Review of Systems:  Constitutional: denies fever, chills  HEENT: denies cough or congestion  Respiratory: denies any shortness of breath  Cardiovascular: denies chest pain or palpitations  Gastrointestinal: abdominal pain, N/V, and bowel function as per interval history Genitourinary: denies burning with urination or urinary frequency Musculoskeletal: denies pain, decreased motor or sensation Integumentary: denies any other rashes or skin discolorations except post-surgical abdominal wounds Neurological: denies HA or vision/hearing changes   Vital signs in last 24 hours: [min-max] current  Temp:  [97.4 F (36.3 C)-98.3 F (36.8 C)] 98.3 F (36.8 C) (10/22 0600) Pulse Rate:  [80-110] 80 (10/22 0600) Resp:  [18] 18 (10/22 0600) BP: (145-149)/(77-88) 149/77 (10/22 0600) SpO2:  [94 %-98 %] 94 % (10/22 0600)     Height: 5\' 7"  (170.2 cm) Weight: 246 lb 6.4 oz (111.8 kg) BMI (Calculated): 38.58   Intake/Output this shift:  No intake/output data recorded.   Intake/Output last 2 shifts:  @IOLAST2SHIFTS @   Physical Exam:  Constitutional: alert, cooperative and no distress  HENT: normocephalic without obvious abnormality  Eyes: PERRL, EOM's grossly intact and symmetric  Neuro: CN II - XII grossly intact and symmetric without deficit  Respiratory: breathing non-labored at rest  Cardiovascular: regular rate and sinus rhythm  Gastrointestinal: abdomen soft and non-distended with mild B/L LQ peri-drain tenderness to palpation with LLQ drain serosanguinous fluid in bulb > RLQ drain, incision well-approximated  without erythema or drainage, ostomy pink and viable with gas and small-moderate amount of serosanguinous fluid in patient's ostomy bag Musculoskeletal: UE and LE FROM, no edema or wounds, motor and sensation grossly intact, NT   Labs:  CBC Latest Ref Rng & Units 04/06/2017 04/05/2017 04/03/2017  WBC 3.6 - 11.0 K/uL 17.8(H) 16.5(H) 9.6  Hemoglobin 12.0 - 16.0 g/dL 9.3(L) 10.4(L) 13.2  Hematocrit 35.0 - 47.0 % 28.3(L) 31.7(L) 40.3  Platelets 150 - 440 K/uL 315 341 529(H)   CMP Latest Ref Rng & Units 04/06/2017 04/05/2017 04/03/2017  Glucose 65 - 99 mg/dL 80 95 125(H)  BUN 6 - 20 mg/dL 13 15 11   Creatinine 0.44 - 1.00 mg/dL 0.83 0.91 0.87  Sodium 135 - 145 mmol/L 134(L) 134(L) 138  Potassium 3.5 - 5.1 mmol/L 3.3(L) 3.5 2.9(L)  Chloride 101 - 111 mmol/L 102 101 104  CO2 22 - 32 mmol/L 24 23 22   Calcium 8.9 - 10.3 mg/dL 7.9(L) 8.1(L) 9.0  Total Protein 6.5 - 8.1 g/dL - - 8.7(H)  Total Bilirubin 0.3 - 1.2 mg/dL - - 0.5  Alkaline Phos 38 - 126 U/L - - 56  AST 15 - 41 U/L - - 20  ALT 14 - 54 U/L - - 12(L)   Imaging studies: No new pertinent imaging studies   Assessment/Plan: (ICD-10's: K59.20) 41 y.o. 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   - advance to full liquids for now  - pain control prn,  minimize narcotics   - monitor ostomy function and abdominal exam  - trend repeat morning WBC for post-operative leukocytosis   - medical management of medical comorbidities  - DVT prophylaxis, ambulation encouraged   All of the above findings and recommendations were discussed with the patient and patient's family, and all of patient's and  family's questions were answered to their expressed satisfaction.  -- Marilynne Drivers Rosana Hoes, MD, [place]: [facility] - Partnering for exceptional care. Office: [phone]

## 2017-04-06 NOTE — Progress Notes (Signed)
Per Dr. Rosana Hoes okay to place order for wound consult for education on colostomy. Also per MD place order for daily dressing changes to JP drains.

## 2017-04-06 NOTE — Progress Notes (Signed)
ANTIBIOTIC CONSULT NOTE - INITIAL  Pharmacy Consult for Zosyn  Indication: intra abdominal  Allergies  Allergen Reactions  . Hydrocodone-Acetaminophen Hives  . Raspberry Swelling and Nausea And Vomiting  . Valium [Diazepam] Hives   Patient Measurements: Height: 5\' 7"  (170.2 cm) Weight: 246 lb 6.4 oz (111.8 kg) IBW/kg (Calculated) : 61.6  Vital Signs: Temp: 98.1 F (36.7 C) (10/22 1218) Temp Source: Oral (10/22 1218) BP: 146/86 (10/22 1218) Pulse Rate: 90 (10/22 1218)  Labs:  Recent Labs  04/03/17 2107 04/05/17 0357 04/06/17 0315  WBC 9.6 16.5* 17.8*  HGB 13.2 10.4* 9.3*  PLT 529* 341 315  CREATININE 0.87 0.91 0.83   Estimated Creatinine Clearance: 112.7 mL/min (by C-G formula based on SCr of 0.83 mg/dL).   Assessment: Pharmacy consulted to dose piperacillin/tazobactam for IAI.  Plan:  Continue piperacillin/tazobactam 3.375 g IV q8h EI  Lenis Noon, PharmD, BCPS Clinical Pharmacist 04/06/2017,1:03 PM

## 2017-04-07 LAB — CBC
HEMATOCRIT: 27.8 % — AB (ref 35.0–47.0)
Hemoglobin: 8.9 g/dL — ABNORMAL LOW (ref 12.0–16.0)
MCH: 28.2 pg (ref 26.0–34.0)
MCHC: 32.1 g/dL (ref 32.0–36.0)
MCV: 87.8 fL (ref 80.0–100.0)
Platelets: 316 10*3/uL (ref 150–440)
RBC: 3.17 MIL/uL — ABNORMAL LOW (ref 3.80–5.20)
RDW: 15.1 % — AB (ref 11.5–14.5)
WBC: 12.7 10*3/uL — ABNORMAL HIGH (ref 3.6–11.0)

## 2017-04-07 LAB — SURGICAL PATHOLOGY

## 2017-04-07 NOTE — Progress Notes (Addendum)
[facility] Hospital Day(s): 3.   Post op day(s): 4 Days Post-Op.   Interval History: Patient seen and examined, no acute events or new complaints overnight. Patient reports she overall continues to feel better though describes occasional crampy RUQ abdominal pain with increasing stool and gas from LUQ colostomy since yesterday and ambulation in the halls yesterday, denies N/V, fever/chills, CP, or SOB.  Review of Systems:  Constitutional: denies fever, chills  HEENT: denies cough or congestion  Respiratory: denies any shortness of breath  Cardiovascular: denies chest pain or palpitations  Gastrointestinal: abdominal pain, N/V, and bowel function as per interval history Genitourinary: denies burning with urination or urinary frequency Musculoskeletal: denies pain, decreased motor or sensation Integumentary: denies any other rashes or skin discolorations Neurological: denies HA or vision/hearing changes   Vital signs in last 24 hours: [min-max] current  Temp:  [98.1 F (36.7 C)-98.4 F (36.9 C)] 98.1 F (36.7 C) (10/23 0411) Pulse Rate:  [86-94] 86 (10/23 0411) Resp:  [17-22] 17 (10/23 0411) BP: (146-155)/(79-86) 148/85 (10/23 0411) SpO2:  [95 %-97 %] 95 % (10/23 0411)     Height: 5\' 7"  (170.2 cm) Weight: 246 lb 6.4 oz (111.8 kg) BMI (Calculated): 38.58   Intake/Output this shift:  No intake/output data recorded.   Intake/Output last 2 shifts:  @IOLAST2SHIFTS @   Physical Exam:  Constitutional: alert, cooperative and no distress  HENT: normocephalic without obvious abnormality  Eyes: PERRL, EOM's grossly intact and symmetric  Neuro: CN II - XII grossly intact and symmetric without deficit  Respiratory: breathing non-labored at rest  Cardiovascular: regular rate and sinus rhythm  Gastrointestinal: abdomen soft and non-distended with decreased mild B/L LQ peri-drain tenderness to palpation compared to yesterday with LLQ drain serosanguinous fluid in bulb >> far  less fluid in RLQ drain, incision well-approximated without erythema or drainage, ostomy pink and viable with gas and stool now in patient's ostomy bag Musculoskeletal: UE and LE FROM, no edema or wounds, motor and sensation grossly intact, NT   Labs:  CBC Latest Ref Rng & Units 04/07/2017 04/06/2017 04/05/2017  WBC 3.6 - 11.0 K/uL 12.7(H) 17.8(H) 16.5(H)  Hemoglobin 12.0 - 16.0 g/dL 8.9(L) 9.3(L) 10.4(L)  Hematocrit 35.0 - 47.0 % 27.8(L) 28.3(L) 31.7(L)  Platelets 150 - 440 K/uL 316 315 341   CMP Latest Ref Rng & Units 04/06/2017 04/05/2017 04/03/2017  Glucose 65 - 99 mg/dL 80 95 125(H)  BUN 6 - 20 mg/dL 13 15 11   Creatinine 0.44 - 1.00 mg/dL 0.83 0.91 0.87  Sodium 135 - 145 mmol/L 134(L) 134(L) 138  Potassium 3.5 - 5.1 mmol/L 3.3(L) 3.5 2.9(L)  Chloride 101 - 111 mmol/L 102 101 104  CO2 22 - 32 mmol/L 24 23 22   Calcium 8.9 - 10.3 mg/dL 7.9(L) 8.1(L) 9.0  Total Protein 6.5 - 8.1 g/dL - - 8.7(H)  Total Bilirubin 0.3 - 1.2 mg/dL - - 0.5  Alkaline Phos 38 - 126 U/L - - 56  AST 15 - 41 U/L - - 20  ALT 14 - 54 U/L - - 12(L)   Imaging studies: No new pertinent imaging studies   Assessment/Plan: (ICD-10's: K28.20) 41 y.o. 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              -  advance to soft diet             - pain control prn, minimize narcotics              - monitor ostomy function and abdominal exam             - check one more WBC tomorrow morning for decreasing post-operative leukocytosis              - medical management of medical comorbidities             - DVT prophylaxis, ambulation encouraged  - discharge planning  10/24 or 10/25  All of the above findings and recommendations were discussed with the patient, and all of patient's questions were answered to their expressed satisfaction.  -- Marilynne Drivers Rosana Hoes, MD, [place]: [facility] - Partnering for exceptional care. Office: [phone]

## 2017-04-07 NOTE — Consult Note (Signed)
[name] ostomy consult note Stoma type/location: LLQ, end colostomy, located directly in a abdominal skin fold Stomal assessment/size: 1" x 1 3/8" oval shaped stoma with os pointing downward at 4 o'clock  Peristomal assessment: intact, leaked over weekend per patient Bedside nurse over the weekend had used 2pc convex with barrier ring, while a very good thought the patient was complaining about the "plastic ring" of the wafer jabbing her and making her very uncomfortable. She is concerned about sitting up with the 2pc.   Will collaborate with [name] nurse team, may need to consider flexible convexity and possibly a belt.  While on the floor it is noted that this type of pouch is not on the floor. WOC will attempt to order from materials for use with next pouch change.  Treatment options for stomal/peristomal skin: used 2" barrier ring around stoma, challenge will be that when patient sits up it essentially occludes her stoma. 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 She is tearful.  I have discussed the creation of her stoma. I have discussed emptying and changing schedule.  She does not want to look at her stoma today, additionally she will not be able see while sitting, as it disappears into a crease when she sits up at all. I talked her through a pouch change, demonstrated measuring her stoma using measuring guide. Discussed the shape of her stoma. Demonstrated how to cut her new pouch barrier, use of barrier ring and rationale for use.   She reports she is planning to move in with her sister to recover. Her sister is a "caregiver" and is reported to know how to change ostomy pouchs.  She would still benefit from [facility] if possible. Provided her with educational materials and step by step guide for pouch change.   Discussed take down surgery, per her request.  Discussed challenges with stoma location.  I will ask [facility] nurse to re-eval her early tomorrow, may DC to her sisters home tomorrow in [place], [place]. This is her first ostomy teaching session, hoping she may be able to have one additional teaching session in the am.    Enrolled patient in [place] program: Yes  [name] Nurse team will follow along with you for continued support with ostomy teaching and care [name] MSN, RN, [place], [place], [place]

## 2017-04-08 LAB — AEROBIC/ANAEROBIC CULTURE (SURGICAL/DEEP WOUND)

## 2017-04-08 LAB — CBC
HEMATOCRIT: 28 % — AB (ref 35.0–47.0)
HEMOGLOBIN: 9 g/dL — AB (ref 12.0–16.0)
MCH: 28.2 pg (ref 26.0–34.0)
MCHC: 32.2 g/dL (ref 32.0–36.0)
MCV: 87.3 fL (ref 80.0–100.0)
Platelets: 316 10*3/uL (ref 150–440)
RBC: 3.2 MIL/uL — AB (ref 3.80–5.20)
RDW: 15 % — ABNORMAL HIGH (ref 11.5–14.5)
WBC: 10.7 10*3/uL (ref 3.6–11.0)

## 2017-04-08 LAB — CULTURE, BLOOD (ROUTINE X 2)
CULTURE: NO GROWTH
Culture: NO GROWTH
Special Requests: ADEQUATE

## 2017-04-08 LAB — AEROBIC/ANAEROBIC CULTURE W GRAM STAIN (SURGICAL/DEEP WOUND)

## 2017-04-08 MED ORDER — ONDANSETRON HCL 4 MG/2ML IJ SOLN
4.0000 mg | Freq: Once | INTRAMUSCULAR | Status: AC
  Administered 2017-04-08: 4 mg via INTRAVENOUS
  Filled 2017-04-08: qty 2

## 2017-04-08 MED ORDER — OXYCODONE-ACETAMINOPHEN 5-325 MG PO TABS: 1.0000 | ORAL_TABLET | ORAL | 0 refills | Status: DC | PRN

## 2017-04-08 MED ORDER — AMOXICILLIN-POT CLAVULANATE 875-125 MG PO TABS: 1.0000 | ORAL_TABLET | Freq: Two times a day (BID) | ORAL | 0 refills | Status: AC

## 2017-04-08 MED ORDER — ONDANSETRON HCL 4 MG/2ML IJ SOLN
4.0000 mg | Freq: Four times a day (QID) | INTRAMUSCULAR | Status: DC | PRN
  Administered 2017-04-08: 4 mg via INTRAVENOUS
  Filled 2017-04-08: qty 2

## 2017-04-08 NOTE — Discharge Instructions (Addendum)
In addition to included general post-operative instructions for Open Colectomy, Colostomy, and Drain Care,  Diet: Maintain hydration (drink water) and resume home heart healthy high fiber diet.   Activity: No heavy lifting >15-20 pounds (children, pets, laundry, garbage) or strenuous activity until follow-up, but light activity and walking are encouraged. Do not drive or drink alcohol if taking narcotic pain medications.  Wound care: Remove Right lower quadrant drain site dressing in 2 days (Friday, 10/26). Continue drain care and record drain volumes as instructed by RN prior to discharge. Once Right lower quadrant drain site dressing has been removed, may shower/get incision wet with soapy water and pat dry (do not rub incisions), but no baths or submerging incision underwater until follow-up. Before showering, remove dressing from Left lower quadrant drain and then reapply dressing after shower.  Medications: Resume all home medications AND complete course of prescribed antibiotics even if feeling better/well. For mild to moderate pain: acetaminophen (Tylenol) or ibuprofen (if no kidney disease). Combining Tylenol with alcohol can substantially increase your risk of causing liver disease. Narcotic pain medications, if prescribed, can be used for severe pain, though may cause nausea, constipation, and drowsiness. Do not combine Tylenol and Percocet within a 6 hour period as Percocet contains Tylenol. If you do not need the narcotic pain medication, you do not need to fill the prescription.  Call office [phone]) at any time if any questions, worsening pain, fevers/chills, bleeding, drainage from incision site, or other concerns.

## 2017-04-08 NOTE — Care Management (Signed)
Patient to discharge home today.  S/p Partial (sigmoid) colectomy with closed rectal stump and creation of end colostomy.  Patient to discharge to her sisters address at [place], [place] [address].  Patient does not have PCP.  Follow up made at [facility] with Lamonte Sakai for 04/09/17.  Patient to discharge with home health nursing.  Patient provided home health agency preference.  Patient states that she does not have a preference.  Referral made to [place] with [facility].  [place] updated with updated contact information, and address in which the patient will go to.  RNCM signing off.

## 2017-04-08 NOTE — Consult Note (Signed)
[facility] Nurse ostomy follow up  Stoma type/location: LLQ, end colostomy, located directly in a abdominal skin fold Stomal assessment/size: 1" x 1 1/4" oval shaped stoma with os pointing downward at 4 o'clock  Peristomal assessment: intact today Pouched her in a 1 piece convex.  The 1 piece flat was beginning to leak already.   Treatment options for stomal/peristomal skin: used 2" barrier ring around stoma, challenge will be that when patient sits up it essentially occludes her stoma. Output:  formed brown stool from stoma, + flatus per patient Ostomy pouching: 1pc.convex used today, with 2" barrier ring no ostomy belt in house but will ask [employer] to overnight one in her supply box.  Education provided:   Patient does not want to look at her stoma today, additionally she will not be able see while sitting, as it disappears into a crease when she sits up at all. I talked her through a pouch change, demonstrated measuring her stoma using measuring guide. Discussed the shape of her stoma. Demonstrated how to cut her new pouch barrier, use of barrier ring and rationale for use.   She reports she is planning to move in with her sister to recover. Her sister is a "caregiver" and is reported to know how to change ostomy pouch.  She would still benefit from [facility] if possible. Provided her with educational materials and step by step guide for pouch change.  Discussed emptying when 1/3 full and not allowing the pouch to become too heavy. She states she is not ready to take care of this and is agreeable to a few home health visits. She is minimally participative in care today.  She does take a phone call during the session.   Enrolled patient in [place] program: Yes Will not follow at this time.  Please re-consult if needed.  Domenic Moras RN BSN [place] Pager [phone]

## 2017-04-09 ENCOUNTER — Ambulatory Visit: Payer: 59 | Admitting: Surgery

## 2017-04-09 LAB — CBC
HCT: 27.5 % — ABNORMAL LOW (ref 35.0–47.0)
Hemoglobin: 9 g/dL — ABNORMAL LOW (ref 12.0–16.0)
MCH: 28.1 pg (ref 26.0–34.0)
MCHC: 32.7 g/dL (ref 32.0–36.0)
MCV: 86 fL (ref 80.0–100.0)
PLATELETS: 322 10*3/uL (ref 150–440)
RBC: 3.19 MIL/uL — AB (ref 3.80–5.20)
RDW: 15 % — AB (ref 11.5–14.5)
WBC: 12.3 10*3/uL — AB (ref 3.6–11.0)

## 2017-04-09 MED ORDER — SENNOSIDES-DOCUSATE SODIUM 8.6-50 MG PO TABS
1.0000 | ORAL_TABLET | Freq: Once | ORAL | Status: AC
  Administered 2017-04-09: 1 via ORAL
  Filled 2017-04-09: qty 1

## 2017-04-09 MED ORDER — DOCUSATE SODIUM 100 MG PO CAPS: 100.0000 mg | ORAL_CAPSULE | Freq: Every day | ORAL | 0 refills | Status: DC | PRN

## 2017-04-09 MED ORDER — POLYETHYLENE GLYCOL 3350 17 G PO PACK
17.0000 g | PACK | Freq: Every day | ORAL | Status: DC
  Administered 2017-04-09: 17 g via ORAL
  Filled 2017-04-09: qty 1

## 2017-04-09 NOTE — Progress Notes (Signed)
Patient discharge teaching given, including activity, diet, follow-up appoints, and medications. Patient verbalized understanding of all discharge instructions. RN demonstrated on how to perform proper dressing changes and how to empty ostomy bag and JP drain. Pt demonstrated understanding by teach back. Multiple supplies were given to pt, spilt gauzes, measuring cups, tape, and JP drain outpt sheet.  IV access was d/c'd. Vitals are stable. Skin is intact except as charted in most recent assessments. Pt to be escorted out by NT, to be driven home by family.  [facility] [employer]

## 2017-04-09 NOTE — Care Management (Signed)
Patient to discharge home today.  [place] with [facility] Notified of discharge.  Requested home health orders from MD

## 2017-04-09 NOTE — Progress Notes (Signed)
ANTIBIOTIC CONSULT NOTE  Pharmacy Consult for Zosyn  Indication: intra abdominal  Allergies  Allergen Reactions  . Hydrocodone-Acetaminophen Hives  . Raspberry Swelling and Nausea And Vomiting  . Valium [Diazepam] Hives   Patient Measurements: Height: 5\' 7"  (170.2 cm) Weight: 246 lb 6.4 oz (111.8 kg) IBW/kg (Calculated) : 61.6  Vital Signs: Temp: 98.2 F (36.8 C) (10/25 0423) Temp Source: Oral (10/25 0423) BP: 154/80 (10/25 0423) Pulse Rate: 80 (10/25 0423)  Labs:  Recent Labs  04/07/17 0434 04/08/17 0357 04/09/17 0415  WBC 12.7* 10.7 12.3*  HGB 8.9* 9.0* 9.0*  PLT 316 316 322   Estimated Creatinine Clearance: 112.7 mL/min (by C-G formula based on SCr of 0.83 mg/dL).   Assessment: Pharmacy consulted to dose piperacillin/tazobactam for IAI.  Plan:  Continue piperacillin/tazobactam 3.375 g IV q8h EI  Rocky Morel, PharmD, BCPS Clinical Pharmacist 04/09/2017,7:31 AM

## 2017-04-10 ENCOUNTER — Telehealth: Payer: Self-pay

## 2017-04-10 NOTE — Telephone Encounter (Signed)
Physician Order faxed to [facility] at this time with positive confirmation.   Fax [phone]  [phone]

## 2017-04-10 NOTE — Telephone Encounter (Signed)
Tried returning call to patient however no voicemail has been set up, unable to leave message.

## 2017-04-10 NOTE — Telephone Encounter (Signed)
Post-op call made to patient at this time. Spoke with [facility]. Post-op interview questions below.  1. How are you feeling? better  2. Is your pain controlled? yes  3. What are you doing for the pain? Pain medication  4. Are you having any Nausea or Vomiting? no  5. Are you having any Fever or Chills? no  6. Are you having any Constipation or Diarrhea? Bowels are nursing.  7. Is there any Swelling or Bruising you are concerned about? no  8. Do you have any questions or concerns at this time? Needs colostomy bags.   Discussion:  Post op appointment 10-31/18 @ 10:45 am. Patient to call me back after she speaks with Home health care nurse.

## 2017-04-10 NOTE — Telephone Encounter (Signed)
Patient returning your phone call about Ostomy Supplies. Please return phone call to [phone].

## 2017-04-11 NOTE — Discharge Summary (Signed)
Physician Discharge Summary  Patient ID: [facility] MRN: [id number] DOB/AGE: 1975-09-27 41 y.o.  Admit date: 04/03/2017 Discharge date: 04/11/2017  Admission Diagnoses:  Discharge Diagnoses:  Active Problems:   Diverticulitis of large intestine with perforation and abscess   Discharged Condition: good  Hospital Course: 41 y.o. female presented to [facility] for acute onset of severely worsened abdominal pain. Workup was found to be significant for CT imaging demonstrating perforated sigmoid colonic diverticulitis with pneumoperitoneum. Informed consent was obtained and documented, and patient underwent laparotomy with open partial colectomy and creation of end colostomy with closed rectal stump and placement of surgical abdominopelvic peritoneal drains [facility], 04/04/2017).  Post-operatively, patient's pain and post-operative ileus improved/resolved, advancement of her diet and ambulation were well-tolerated, and RLQ surgically placed drain produced very little serosanguinous fluid and was removed. The remainder of patient's hospital course was essentially unremarkable, and discharge planning was initiated accordingly with patient safely able to be discharged home with appropriate discharge instructions, antibiotics, pain control, and outpatient surgical follow-up after all of her and family's questions were answered to their expressed satisfaction.  Consults: None except wound ostomy nurse  Significant Diagnostic Studies: radiology: CT scan: perforated sigmoid colonic diverticulitis with pneumoperitoneum  Treatments: IV hydration, antibiotics: Zosyn and surgery: laparotomy with open partial colectomy and creation of end colostomy with closed rectal stump (Hartmann's procedure) and placement of surgical abdominopelvic drainags  Discharge Exam: Blood pressure (!) 154/83, pulse 86, temperature 98.8 F (37.1 C), temperature source Oral, resp. rate 18, height 5\' 7"  (1.702 m), weight 246 lb  6.4 oz (111.8 kg), last menstrual period 01/28/2015, SpO2 99 %. General appearance: alert, cooperative and no distress GI: abdomen soft and non-distended with decreased mild B/L LQ peri-drain tenderness to palpation compared to yesterday with LLQ drain serosanguinous fluid in bulb >> far less fluid in RLQ drain, incision well-approximated without erythema or drainage, ostomy pink and viable with gas and stool in patient's colostomy bag  Disposition: 01-Home or Self Care   Allergies as of 04/09/2017      Reactions   Hydrocodone-acetaminophen Hives   Raspberry Swelling, Nausea And Vomiting   Valium [diazepam] Hives      Medication List    TAKE these medications   amoxicillin-clavulanate 875-125 MG tablet Commonly known as:  AUGMENTIN Take 1 tablet by mouth 2 (two) times daily.   docusate sodium 100 MG capsule Commonly known as:  COLACE Take 1 capsule (100 mg total) by mouth daily as needed for mild constipation or moderate constipation.   ibuprofen 600 MG tablet Commonly known as:  ADVIL,MOTRIN Take 600 mg by mouth every 6 (six) hours as needed.   ondansetron 4 MG tablet Commonly known as:  ZOFRAN Take 1 tablet (4 mg total) by mouth every 8 (eight) hours as needed for nausea or vomiting.   oxyCODONE-acetaminophen 5-325 MG tablet Commonly known as:  PERCOCET/ROXICET Take 1-2 tablets by mouth every 4 (four) hours as needed for severe pain.      Follow-up Information    Vickie Epley, MD. Go on 04/15/2017.   Specialty:  General Surgery Why:  Wednesday at 10:45am for hospital follow-up Contact information: [place] [address] [phone]        Perrin Maltese, MD. Go on 04/09/2017.   Specialty:  Internal Medicine Why:  Arrive at 12:30 pm Bring discharge paper work from hospital, any perscriptions that you are currently on, ID, and Insurance card Contact information: [place] [address]  [phone]            Signed: Vickie Epley 04/11/2017, 12:24 PM

## 2017-04-15 ENCOUNTER — Encounter: Payer: Self-pay | Admitting: Surgery

## 2017-04-15 ENCOUNTER — Ambulatory Visit (INDEPENDENT_AMBULATORY_CARE_PROVIDER_SITE_OTHER): Payer: 59 | Admitting: Surgery

## 2017-04-15 VITALS — BP 153/93 | HR 101 | Temp 98.9°F | Ht 67.0 in | Wt 221.1 lb

## 2017-04-15 DIAGNOSIS — K5720 Diverticulitis of large intestine with perforation and abscess without bleeding: Secondary | ICD-10-CM

## 2017-04-15 MED ORDER — OXYCODONE-ACETAMINOPHEN 5-325 MG PO TABS: 1.0000 | ORAL_TABLET | Freq: Four times a day (QID) | ORAL | 0 refills | Status: DC | PRN

## 2017-04-15 NOTE — Patient Instructions (Signed)
We have given you the last of the Oxycodone that you will receive. After finishing this prescription, please begin taking Ibuprofen 600mg  (3 tablets) every 6 hours as needed for pain. Make sure that you have a full stomach prior to taking any Ibuprofen.  Finish antibiotics and then you will not need any further.  We will see you back in early January to discuss what is needed prior to your Colostomy reversal. Please see your appointment below.  If you have any questions or concerns, please call our office and speak with a nurse.  GENERAL POST-OPERATIVE PATIENT INSTRUCTIONS   WOUND CARE INSTRUCTIONS:  Keep a dry clean dressing on the wound if there is drainage. The initial bandage may be removed after 24 hours.  Once the wound has quit draining you may leave it open to air.  If clothing rubs against the wound or causes irritation and the wound is not draining you may cover it with a dry dressing during the daytime.  Try to keep the wound dry and avoid ointments on the wound unless directed to do so.  If the wound becomes bright red and painful or starts to drain infected material that is not clear, please contact your physician immediately.  If the wound is mildly pink and has a thick firm ridge underneath it, this is normal, and is referred to as a healing ridge.  This will resolve over the next 4-6 weeks.  BATHING: You may shower if you have been informed of this by your surgeon. However, Please do not submerge in a tub, hot tub, or pool until incisions are completely sealed or have been told by your surgeon that you may do so.  DIET:  You may eat any foods that you can tolerate.  It is a good idea to eat a high fiber diet and take in plenty of fluids to prevent constipation.  If you do become constipated you may want to take a mild laxative or take ducolax tablets on a daily basis until your bowel habits are regular.  Constipation can be very uncomfortable, along with straining, after recent  surgery.  ACTIVITY:  You are encouraged to cough and deep breath or use your incentive spirometer if you were given one, every 15-30 minutes when awake.  This will help prevent respiratory complications and low grade fevers post-operatively if you had a general anesthetic.  You may want to hug a pillow when coughing and sneezing to add additional support to the surgical area, if you had abdominal or chest surgery, which will decrease pain during these times.  You are encouraged to walk and engage in light activity for the next two weeks.  You should not lift more than 20 pounds, until 05/15/2017 as it could put you at increased risk for complications.  Twenty pounds is roughly equivalent to a plastic bag of groceries. At that time- Listen to your body when lifting, if you have pain when lifting, stop and then try again in a few days. Soreness after doing exercises or activities of daily living is normal as you get back in to your normal routine.  MEDICATIONS:  Try to take narcotic medications and anti-inflammatory medications, such as tylenol, ibuprofen, naprosyn, etc., with food.  This will minimize stomach upset from the medication.  Should you develop nausea and vomiting from the pain medication, or develop a rash, please discontinue the medication and contact your physician.  You should not drive, make important decisions, or operate machinery when taking narcotic  pain medication.  SUNBLOCK Use sun block to incision area over the next year if this area will be exposed to sun. This helps decrease scarring and will allow you avoid a permanent darkened area over your incision.  QUESTIONS:  Please feel free to call our office if you have any questions, and we will be glad to assist you. [phone]

## 2017-04-15 NOTE — Progress Notes (Signed)
Surgical Clinic Progress/Follow-up Note   HPI:  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 Patient reports she's feeling well and doing well with mild peri-incisional and peri-drain pain, regular soft formed stool from colostomy, improved appetite, healthier diet, and minimal serosanguinous drainage into her drain, denies N/V, fever/chills, CP, or SOB.  Review of Systems:  Constitutional: denies any other weight loss, fever, chills, or sweats  Eyes: denies any other vision changes, history of eye injury  ENT: denies sore throat, hearing problems  Respiratory: denies shortness of breath, wheezing  Cardiovascular: denies chest pain, palpitations  Gastrointestinal: abdominal pain, N/V, and bowel function as per HPI Musculoskeletal: denies any other joint pains or cramps  Skin: Denies any other rashes or skin discolorations  Neurological: denies any other headache, dizziness, weakness  Psychiatric: denies any other depression, anxiety  All other review of systems: otherwise negative   Vital Signs:  BP (!) 153/93   Pulse (!) 101   Temp 98.9 F (37.2 C) (Oral)   Ht 5\' 7"  (1.702 m)   Wt 221 lb 1.6 oz (100.3 kg)   LMP 01/28/2015 (Exact Date)   BMI 34.63 kg/m    Physical Exam:  Constitutional:  -- Overweight body habitus  -- Awake, alert, and oriented x3  Eyes:  -- Pupils equally round and reactive to light  -- No scleral icterus  Ear, nose, throat:  -- No jugular venous distension  -- No nasal drainage, bleeding Pulmonary:  -- No crackles -- Equal breath sounds bilaterally -- Breathing non-labored at rest Cardiovascular:  -- S1, S2 present  -- No pericardial rubs  Gastrointestinal:  -- Soft, nontender, non-distended, no guarding/rebound  -- Incision well-approximated without erythema or drainage, minimal  peri-incisional and peri-drain tenderness to palpation, drain well-secured with scant serous non-purulent fluid in drain bulb -- No abdominal masses appreciated, pulsatile or otherwise  Musculoskeletal / Integumentary:  -- Wounds or skin discoloration: None appreciated except post-surgical abdominal wounds as above (GI)  -- Extremities: B/L UE and LE FROM, hands and feet warm, no edema  Neurologic:  -- Motor function: intact and symmetric  -- Sensation: intact and symmetric   Assessment:  41 y.o. yo Female with a problem list including...  Patient Active Problem List   Diagnosis Date Noted  . Diverticulitis of large intestine with perforation and abscess 04/04/2017  . Bowel perforation ([place])   . Perforated diverticulum of large intestine   . Diverticulitis 03/20/2017  . 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 Abnormal uterine bleeding 01/12/2015  . 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 Intramural leiomyoma of uterus 01/11/2015  . 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 Menometrorrhagia 04/18/2014  . 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  Plan:   - staples removed, steri-strips applied  - drain removed, gauze and adhesive dressing applied   - no heavy lifting >40 lbs x 2 - 4 more weeks, ok to shower again in 2 days, then may resume baths/swimming  - return to clinic in 2 months for pre-reversal of colostomy planning including barium enema and colonoscopy  - instructed to call office if any questions or concerns  All of the above  recommendations were discussed with the patient, and all of patient's questions were answered to her expressed satisfaction.  -- Marilynne Drivers Rosana Hoes, MD, [place]: [facility] - Partner[facility] for exceptional care. Office: [phone]

## 2017-04-20 ENCOUNTER — Telehealth: Payer: Self-pay | Admitting: Surgery

## 2017-04-20 NOTE — Telephone Encounter (Signed)
Patient has called and states that when she went to the bathroom, she had a small bowel movement-brownish. She would like to talk to a nurse to make sure this is ok. She did say that it was so little bowel that it basically dissolved away.   Surgery-Partial (sigmoid) colectomy with closed rectal stump and creation of end colostomy (Hartmann's procedure)--with Dr Rosana Hoes on 04/04/17.

## 2017-04-20 NOTE — Telephone Encounter (Signed)
Returned call to patient at this time. I advised her that have a small bowel movement is normal and that the bowel that is being passed is what is leftover in the rectum. She stated that she has been to the restroom twice and had to very small bowel movements. I advised her that it was normal. I asked if her colostomy bag was doing okay she advised that it was and stated that her anxiety has now went down since hearing that it was normal for her to have a bowel movement. Advised to give our office a call if she has any questions or concerns that we can address. Patient verbalized understanding.

## 2017-04-28 ENCOUNTER — Ambulatory Visit (INDEPENDENT_AMBULATORY_CARE_PROVIDER_SITE_OTHER): Payer: 59 | Admitting: Surgery

## 2017-04-28 ENCOUNTER — Telehealth: Payer: Self-pay

## 2017-04-28 ENCOUNTER — Encounter: Payer: Self-pay | Admitting: Surgery

## 2017-04-28 VITALS — BP 135/89 | HR 111 | Temp 98.3°F | Ht 67.0 in | Wt 211.0 lb

## 2017-04-28 DIAGNOSIS — Z4889 Encounter for other specified surgical aftercare: Secondary | ICD-10-CM

## 2017-04-28 NOTE — Patient Instructions (Signed)
We will contact you home health nurse with new orders for wound care.  If you have increased fluid output from the wound please give our office a call.  If you have any questions or concerns please give our office a call.

## 2017-04-28 NOTE — Telephone Encounter (Signed)
Call made to [facility] home health at this time. Spoke with Latoya and advised that I will be sending over new orders for patients wound care. She gave me the office fax number and I verbalized I would send over the order.   Physician order typed and faxed to [facility]. Positive confirmation received .

## 2017-04-28 NOTE — Progress Notes (Signed)
Surgical Clinic Progress/Follow-up Note   HPI:  [name] presents to clinic today for post-surgical wound evaluation ~1 month s/p emergent and technically rather challenging Hartmann's sigmoid colectomy with creation of end colostomy and closed rectal stump for perforated sigmoid colonic diverticulitis following 1 month of alternating PO and IV antibiotics for sigmoid colonic diverticulitis with a small pelvic abscesses not amenable to image-guided percutaneous drainage. Patient reports she has been feeling very well with increased energy and she's been enjoying eating without the pain and nausea that characterized her month prior to her emergent surgery. She stopped by the surgery office today to make a future appointment for evaluation of a mid-vertical laparotomy wound area and was offered an appointment today. She says there is a focal site at the middle of her incision that has not healed as well as the rest of her well-healing wound, for which her home health RN recently advised her to apply wet-to-dry dressings, which she's been changing once to twice daily. She otherwise reports regular stool from her colostomy and denies any abdominal pain, fever/chills, N/V, CP, or SOB.  Review of Systems:  Constitutional: denies any other weight loss, fever, chills, or sweats  Eyes: denies any other vision changes, history of eye injury  ENT: denies sore throat, hearing problems  Respiratory: denies shortness of breath, wheezing  Cardiovascular: denies chest pain, palpitations  Gastrointestinal: denies abdominal pain or N/V with bowel function as per HPI Musculoskeletal: denies any other joint pains or cramps  Skin: Denies any other rashes or skin discolorations  Neurological: denies any other headache, dizziness, weakness  Psychiatric: denies any other depression, anxiety  All other review of systems: otherwise negative   Vital Signs:  BP 135/89   Pulse (!) 111   Temp 98.3 F (36.8 C)  (Oral)   Ht 5\' 7"  (1.702 m)   Wt 211 lb (95.7 kg)   LMP 01/28/2015 (Exact Date)   BMI 33.05 kg/m    Physical Exam:  Constitutional:  -- Obese body habitus  -- Awake, alert, and oriented x3  Eyes:  -- Pupils equally round and reactive to light  -- No scleral icterus  Ear, nose, throat:  -- No jugular venous distension  -- No nasal drainage, bleeding Pulmonary:  -- No crackles -- Equal breath sounds bilaterally -- Breathing non-labored at rest Cardiovascular:  -- S1, S2 present  -- No pericardial rubs  Gastrointestinal:  -- Soft, nontender, non-distended, no guarding/rebound  -- Pink healthy-appearing colostomy with stool and gas in colostomy bag, well-approximated vertical lower midline wound except 1 cm area in the middle of the wound which appears pink though with a small amount of yellow-clear fluid expressed with application of direct pressure, no surrounding erythema or purulent/feculent drainage -- No abdominal masses appreciated, pulsatile or otherwise  Musculoskeletal / Integumentary:  -- Wounds or skin discoloration: None appreciated except as described above (GI)  -- Extremities: B/L UE and LE FROM, hands and feet warm, no edema  Neurologic:  -- Motor function: intact and symmetric  -- Sensation: intact and symmetric   Laboratory studies:  CBC:  Lab Results  Component Value Date   WBC 12.3 (H) 04/09/2017   RBC 3.19 (L) 04/09/2017   BMP:  Lab Results  Component Value Date   GLUCOSE 80 04/06/2017   GLUCOSE 75 05/11/2013   CO2 24 04/06/2017   CO2 23 05/11/2013   BUN 13 04/06/2017   BUN 10 05/11/2013   CREATININE 0.83 04/06/2017   CREATININE 0.68 05/11/2013  CALCIUM 7.9 (L) 04/06/2017   CALCIUM 8.7 05/11/2013     Assessment:  41 y.o. yo Female with a problem list including...  Patient Active Problem List   Diagnosis Date Noted  . Diverticulitis of large intestine with perforation and abscess 04/04/2017  . Bowel perforation ([place])   . Perforated  diverticulum of large intestine   . Diverticulitis 03/20/2017  . 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 Abnormal uterine bleeding 01/12/2015  . 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 Intramural leiomyoma of uterus 01/11/2015  . 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 Menometrorrhagia 04/18/2014  . 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  Plan:   - continue to optimize hydration and nutrition   - no indication for urgent/emergent surgical intervention at this time  - may require a second smaller ostomy appliance to protect skin surrounding open lower-midline wound  - will require fistulogram as part of patient's workup prior to elective reversal of patient's end-colostomy and likely SBR with primary anastomosis  - patient advised to call office and schedule reassessment if output increases with difficulty keeping hydrated or feeling less well  - return to clinic as previously scheduled, instructed to call office if any questions or concerns  The above diagnosis and all of the above recommendations were discussed with the patient, and all of patient's questions were answered to her expressed satisfaction.  -- Marilynne Drivers Rosana Hoes, MD, [place]: [facility] - [facility] for exceptional care. Office:  [phone]

## 2017-06-02 ENCOUNTER — Telehealth: Payer: Self-pay | Admitting: Surgery

## 2017-06-02 NOTE — Telephone Encounter (Signed)
Spoke with Dr.Davis and due to patient being released to work without restrictions we are unable to provide a work note to excuse her from work the past 2 days. She stated her sister passed away September 15, 2022 and she stayed home from work.

## 2017-06-02 NOTE — Telephone Encounter (Signed)
Patient is still out of work. Would like to see if she can get a note for work. Please call

## 2017-06-02 NOTE — Telephone Encounter (Signed)
Left message at this time in regards to needing note for work.  Patient needs to be seen in office.

## 2017-06-18 ENCOUNTER — Telehealth: Payer: Self-pay | Admitting: Surgery

## 2017-06-18 ENCOUNTER — Telehealth: Payer: Self-pay

## 2017-06-18 NOTE — Telephone Encounter (Signed)
Returned call to patient at this time. I advised patient that I would have a letter for her at the front desk for her pick up.

## 2017-06-18 NOTE — Telephone Encounter (Signed)
Call made to patient at this time and I advised her that after speaking with the nurse that we can not give her a note for missing work due to being low on supply. When I asked patient how long she would need the note to express her absence she stated "just for today, and that she would be able to return tomorrow" I advised patient that it is her responsibility to keep track of low supply. Patient seemed disappointment but verbalized understanding.

## 2017-06-18 NOTE — Telephone Encounter (Signed)
Patient called said she needed a note for work, due to the patient needed more supplies for her colonoscopy bag, patient was given Karens number to call and try and get more supplies, patient wasn't able to get supplies and is needing a note to work. Please call patient when the letter is ready for pickup.

## 2017-06-26 ENCOUNTER — Ambulatory Visit (INDEPENDENT_AMBULATORY_CARE_PROVIDER_SITE_OTHER): Payer: 59 | Admitting: Surgery

## 2017-06-26 ENCOUNTER — Encounter: Payer: Self-pay | Admitting: Surgery

## 2017-06-26 VITALS — BP 113/72 | HR 69 | Temp 98.2°F | Ht 67.0 in | Wt 221.0 lb

## 2017-06-26 DIAGNOSIS — Z933 Colostomy status: Secondary | ICD-10-CM

## 2017-06-26 NOTE — Patient Instructions (Signed)
We have scheduled you for a Barium Enema today. Your appointment has been scheduled for 07/01/2017 at [facility]. You will need to arrive at 10:15AM to register for this test.  Bring a list of medications with you to your appointment.  You will need to be on clear liquid diet for 24 hours prior to your appointment and may not have anything to eat or drink after midnight the day of your testing.  If you need to reschedule your Scan, you may do so by calling [phone].   We have sent over a referral for your Colonoscopy at [place]. If you do not hear from them by Tuesday 1/15 please give our office a call.

## 2017-06-27 ENCOUNTER — Encounter: Payer: Self-pay | Admitting: Surgery

## 2017-06-27 NOTE — Progress Notes (Signed)
Surgical Clinic Progress/Follow-up Note   HPI:  42 y.o. Female presents to clinic for follow-up evaluation and to discuss reversal of her Hartmann's colostomy, created 04/04/2017 for perforated diverticulitis. Patient reports daily soft non-bloody BM's via colostomy without constipation or loose BM's, denies abdominal pain, N/V, colostomy difficulty, fever/chills, CP, or SOB. 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  Review of Systems:  Constitutional: denies any other weight loss, fever, chills, or sweats  Eyes: denies any other vision changes, history of eye injury  ENT: denies sore throat, hearing problems  Respiratory: denies shortness of breath, wheezing  Cardiovascular: denies chest pain, palpitations  Gastrointestinal: abdominal pain, N/V, and bowel function as per HPI Musculoskeletal: denies any other joint pains or cramps  Skin: Denies any other rashes or skin discolorations  Neurological: denies any other headache, dizziness, weakness  Psychiatric: denies any other depression, anxiety  All other review of systems: otherwise negative   Vital Signs:  BP 113/72   Pulse 69   Temp 98.2 F (36.8 C) (Oral)   Ht 5\' 7"  (1.702 m)   Wt 221 lb (100.2 kg)   LMP 01/28/2015 (Exact Date)   BMI 34.61 kg/m    Physical Exam:  Constitutional:  -- Obese body habitus  -- Awake, alert, and oriented x3  Eyes:  -- Pupils equally round and reactive to light  -- No scleral icterus  Ear, nose, throat:  -- No jugular venous distension  -- No nasal drainage, bleeding Pulmonary:  -- No crackles -- Equal breath sounds bilaterally -- Breathing non-labored at rest Cardiovascular:  -- S1, S2 present  -- No pericardial rubs  Gastrointestinal:  -- Soft, nontender, non-distended, no guarding/rebound  -- Pink, healthy, albeit skin-level, Left-sided  colostomy with gas and stool in colostomy bag -- Well-healed midline laparotomy post-surgical skin incision -- No abdominal masses appreciated, pulsatile or otherwise  Musculoskeletal / Integumentary:  -- Wounds or skin discoloration: None appreciated except as described above (GI)  -- Extremities: B/L UE and LE FROM, hands and feet warm  Neurologic:  -- Motor function: intact and symmetric  -- Sensation: intact and symmetric   Laboratory studies:  CBC:  Lab Results  Component Value Date   WBC 12.3 (H) 04/09/2017   RBC 3.19 (L) 04/09/2017   BMP:  Lab Results  Component Value Date   GLUCOSE 80 04/06/2017   GLUCOSE 75 05/11/2013   CO2 24 04/06/2017   CO2 23 05/11/2013   BUN 13 04/06/2017   BUN 10 05/11/2013   CREATININE 0.83 04/06/2017   CREATININE 0.68 05/11/2013   CALCIUM 7.9 (L) 04/06/2017   CALCIUM 8.7 05/11/2013     Assessment:  42 y.o. yo obese Female with a problem list including...  Patient Active Problem List   Diagnosis Date Noted  . Diverticulitis of large intestine with perforation and abscess 04/04/2017  . Bowel perforation ([place])   . Perforated diverticulum of large intestine   . Diverticulitis 03/20/2017  . 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 Abnormal uterine bleeding 01/12/2015  . 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 Intramural leiomyoma of uterus 01/11/2015  . 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 Menometrorrhagia 04/18/2014  . Submucous leiomyoma of uterus 04/18/2014    presents to clinic for follow-up evaluation and to discuss reversal of her Hartmann's colostomy, created 04/04/2017 for  perforated diverticulitis, otherwise doing well and just recently quit smoking.  Plan:   - weight loss encouraged prior to colostomy reversal  - applauded for smoking cessation, encouraged to not resume  - pre-reversal colonoscopy (referral initiated) and barium enema  - anticipate reversal of  colostomy early March (~4 - 5 months following creation)  - considering technical difficulty of colostomy creation, reversal will definitely require takedown of splenic flexure  - return to clinic upon completion of the above, instructed to call office if any questions or concerns  All of the above recommendations were discussed with the patient, and all of patient's questions were answered to her expressed satisfaction.  -- Marilynne Drivers Rosana Hoes, MD, [place]: [facility] - [facility] for exceptional care. Office: [phone]

## 2017-06-29 ENCOUNTER — Other Ambulatory Visit: Payer: Self-pay

## 2017-06-29 DIAGNOSIS — K5732 Diverticulitis of large intestine without perforation or abscess without bleeding: Secondary | ICD-10-CM

## 2017-06-30 ENCOUNTER — Telehealth: Payer: Self-pay

## 2017-06-30 NOTE — Telephone Encounter (Signed)
Radiology has called and asked about the two orders for the Barium Enema, one was put in on 06/26/17 and the other on 06/29/17. I advised radiology, per your note, that this should be canceled due to patient needing a colonoscopy prior to having the barium enema. This appointment has been canceled and a referral was added for patient to see [facility] for a colonoscopy.

## 2017-06-30 NOTE — Telephone Encounter (Signed)
Call made to patient at this time. I left a message with her mother to have her to give our office a call back so that I can let her know that we will need to postpone her Barium Enema at this time due to wanting to wait for her to have a colonoscopy first.

## 2017-06-30 NOTE — Telephone Encounter (Signed)
Call made to patient at this time. I was advised by her sister Abigail Butts that she is still at work. I asked the sister to give her a message for me in case I miss her call before the office closes tomorrow. I asked if she could tell her not to do the bowel prep and that her appointment for the Barium Enema is cancelled at this time and will be rescheduled closer to the date of her colonoscopy. She verbalized understanding and said that she would let her know. I told her to have Ms. Bunkley give our office a call if she need more information or had an questions.

## 2017-07-01 ENCOUNTER — Ambulatory Visit: Payer: 59

## 2017-07-02 ENCOUNTER — Telehealth: Payer: Self-pay | Admitting: Gastroenterology

## 2017-07-02 NOTE — Telephone Encounter (Signed)
Patient left a voice message that she needs to schedule a colonoscopy but has a colostomy in place. Please call to schedule. Call before 4:00 at work

## 2017-07-07 ENCOUNTER — Other Ambulatory Visit: Payer: Self-pay

## 2017-07-07 DIAGNOSIS — Z933 Colostomy status: Secondary | ICD-10-CM

## 2017-07-07 NOTE — Telephone Encounter (Signed)
Patient has been scheduled for her colonoscopy given the basic prepping instructions in addition to asking her to take 1 enema for hartman's pouch.   Gastroenterology Pre-Procedure Review  Request Date: 07/21/17 Requesting Physician: Dr. Marius Ditch  PATIENT REVIEW QUESTIONS: The patient responded to the following health history questions as indicated:    1. Are you having any GI issues? Colostomy 2. Do you have a personal history of Polyps? no 3. Do you have a family history of Colon Cancer or Polyps? no 4. Diabetes Mellitus? no 5. Joint replacements in the past 12 months?no 6. Major health problems in the past 3 months?only placement of colostomy  7. Any artificial heart valves, MVP, or defibrillator?no    MEDICATIONS & ALLERGIES:    Patient reports the following regarding taking any anticoagulation/antiplatelet therapy:   Plavix, Coumadin, Eliquis, Xarelto, Lovenox, Pradaxa, Brilinta, or Effient? no Aspirin? no  Patient confirms/reports the following medications:  No current outpatient medications on file.   No current facility-administered medications for this visit.     Patient confirms/reports the following allergies:  Allergies  Allergen Reactions  . Hydrocodone-Acetaminophen Hives  . Hydrocodone Hives  . Raspberry Nausea And Vomiting and Swelling  . Valium [Diazepam] Hives    No orders of the defined types were placed in this encounter.   AUTHORIZATION INFORMATION Primary Insurance: 1D#: Group #:  Secondary Insurance: 1D#: Group #:  SCHEDULE INFORMATION: Date: 07/21/17 Time: Location:[facility]

## 2017-07-08 ENCOUNTER — Telehealth: Payer: Self-pay

## 2017-07-08 ENCOUNTER — Other Ambulatory Visit: Payer: Self-pay

## 2017-07-08 DIAGNOSIS — Z933 Colostomy status: Secondary | ICD-10-CM

## 2017-07-08 NOTE — Telephone Encounter (Addendum)
Call made to Central Scheduling spoke with Manuela Schwartz and advised that I needed a Barium Enema scheduled after 2/5. She was able to get it scheduled for 2/6 at 9:00. Advised me that patient is to pick up prep at the [facility] and is to be NPO 4 hours prior. She is to arrive at 8:45. I verbalized understanding and will advise patient.   Call made to patient was unable to leave a voicemail due to mailbox not being setup yet. I called other number on file and spoke with patients mother letting her know to have patient give [place] a call regarding her upcoming appointment.

## 2017-07-16 ENCOUNTER — Other Ambulatory Visit: Payer: Self-pay

## 2017-07-16 MED ORDER — PEG 3350-KCL-NABCB-NACL-NASULF 236 G PO SOLR: ORAL | 0 refills | Status: DC

## 2017-07-20 ENCOUNTER — Encounter: Payer: Self-pay | Admitting: *Deleted

## 2017-07-21 ENCOUNTER — Encounter
Admission: RE | Disposition: A | Discharge status: Home / Self Care | Payer: Self-pay | Source: Ambulatory Visit | Attending: Gastroenterology

## 2017-07-21 ENCOUNTER — Ambulatory Visit
Admission: RE | Admit: 2017-07-21 | Discharge: 2017-07-21 | Disposition: A | Discharge status: Home / Self Care | Payer: 59 | Source: Ambulatory Visit | Attending: Gastroenterology | Admitting: Gastroenterology

## 2017-07-21 ENCOUNTER — Encounter: Payer: Self-pay | Admitting: *Deleted

## 2017-07-21 ENCOUNTER — Encounter: Admission: RE | Payer: Self-pay | Source: Ambulatory Visit

## 2017-07-21 ENCOUNTER — Ambulatory Visit: Payer: 59 | Admitting: Anesthesiology

## 2017-07-21 ENCOUNTER — Ambulatory Visit: Admission: RE | Admit: 2017-07-21 | Payer: 59 | Source: Ambulatory Visit | Admitting: Gastroenterology

## 2017-07-21 DIAGNOSIS — Z09 Encounter for follow-up examination after completed treatment for conditions other than malignant neoplasm: Secondary | ICD-10-CM | POA: Diagnosis not present

## 2017-07-21 DIAGNOSIS — Z933 Colostomy status: Secondary | ICD-10-CM | POA: Diagnosis not present

## 2017-07-21 DIAGNOSIS — K5720 Diverticulitis of large intestine with perforation and abscess without bleeding: Secondary | ICD-10-CM | POA: Diagnosis not present

## 2017-07-21 DIAGNOSIS — F17210 Nicotine dependence, cigarettes, uncomplicated: Secondary | ICD-10-CM | POA: Diagnosis not present

## 2017-07-21 HISTORY — PX: COLONOSCOPY WITH PROPOFOL: SHX5780

## 2017-07-21 SURGERY — COLONOSCOPY WITH PROPOFOL
Anesthesia: General

## 2017-07-21 MED ORDER — FENTANYL CITRATE (PF) 100 MCG/2ML IJ SOLN
INTRAMUSCULAR | Status: AC
  Filled 2017-07-21: qty 2

## 2017-07-21 MED ORDER — PROPOFOL 500 MG/50ML IV EMUL
INTRAVENOUS | Status: DC | PRN
  Administered 2017-07-21: 140 ug/kg/min via INTRAVENOUS

## 2017-07-21 MED ORDER — LIDOCAINE HCL (CARDIAC) 20 MG/ML IV SOLN
INTRAVENOUS | Status: DC | PRN
  Administered 2017-07-21: 60 mg via INTRAVENOUS

## 2017-07-21 MED ORDER — SODIUM CHLORIDE 0.9 % IV SOLN
INTRAVENOUS | Status: DC
  Administered 2017-07-21: 1000 mL via INTRAVENOUS
  Administered 2017-07-21: 17:00:00 via INTRAVENOUS

## 2017-07-21 MED ORDER — PROPOFOL 10 MG/ML IV BOLUS
INTRAVENOUS | Status: DC | PRN
  Administered 2017-07-21: 60 mg via INTRAVENOUS
  Administered 2017-07-21: 20 mg via INTRAVENOUS

## 2017-07-21 MED ORDER — PROPOFOL 10 MG/ML IV BOLUS
INTRAVENOUS | Status: AC
  Filled 2017-07-21: qty 20

## 2017-07-21 NOTE — Transfer of Care (Signed)
Immediate Anesthesia Transfer of Care Note  Patient: Dana West  Procedure(s) Performed: COLONOSCOPY WITH PROPOFOL (N/A )  Patient Location: PACU  Anesthesia Type:General  Level of Consciousness: sedated  Airway & Oxygen Therapy: Patient Spontanous Breathing and Patient connected to nasal cannula oxygen  Post-op Assessment: Report given to RN and Post -op Vital signs reviewed and stable  Post vital signs: Reviewed and stable  Last Vitals:  Vitals:   07/21/17 1457 07/21/17 1657  BP: 130/74 (!) 117/46  Pulse: 74 79  Resp: 20 18  Temp: 36.8 C (!) 36.3 C  SpO2: 100% 100%    Last Pain:  Vitals:   07/21/17 1657  TempSrc: Tympanic         Complications: No apparent anesthesia complications

## 2017-07-21 NOTE — Op Note (Signed)
[facility] Patient Name: Dana West Procedure Date: 07/21/2017 4:14 PM MRN: [id number] Account #: [id number] Date of Birth: 10-02-1975 Admit Type: Outpatient Age: 42 Room: [facility] ENDO ROOM 4 Gender: Female Note Status: Finalized Procedure:            Colonoscopy Indications:          prior to ostomy takedown, h/o perforated diverticulitis                        s/p colostomy, colonoscopy to r/o neoplasm Providers:            Lin Landsman MD, MD Referring MD:         Tama High (Referring MD) Medicines:            Monitored Anesthesia Care Complications:        No immediate complications. Estimated blood loss: None. Procedure:            Pre-Anesthesia Assessment:                       - Prior to the procedure, a History and Physical was                        performed, and patient medications and allergies were                        reviewed. The patient is competent. The risks and                        benefits of the procedure and the sedation options and                        risks were discussed with the patient. All questions                        were answered and informed consent was obtained.                        Patient identification and proposed procedure were                        verified by the physician, the nurse, the                        anesthesiologist, the anesthetist and the technician in                        the pre-procedure area in the procedure room in the                        endoscopy suite. Mental Status Examination: alert and                        oriented. Airway Examination: normal oropharyngeal                        airway and neck mobility. Respiratory Examination:                        clear to auscultation. CV Examination: normal.  Prophylactic Antibiotics: The patient does not require                        prophylactic antibiotics. Prior Anticoagulants: The               patient has taken no previous anticoagulant or                        antiplatelet agents. ASA Grade Assessment: II - A                        patient with mild systemic disease. After reviewing the                        risks and benefits, the patient was deemed in                        satisfactory condition to undergo the procedure. The                        anesthesia plan was to use monitored anesthesia care                        (MAC). Immediately prior to administration of                        medications, the patient was re-assessed for adequacy                        to receive sedatives. The heart rate, respiratory rate,                        oxygen saturations, blood pressure, adequacy of                        pulmonary ventilation, and response to care were                        monitored throughout the procedure. The physical status                        of the patient was re-assessed after the procedure.                       After obtaining informed consent, the colonoscope was                        passed under direct vision. Throughout the procedure,                        the patient's blood pressure, pulse, and oxygen                        saturations were monitored continuously. The                        Colonoscope was introduced through the sigmoid                        colostomy and advanced to the the cecum, identified by  appendiceal orifice and ileocecal valve. The                        colonoscopy was performed without difficulty. The                        patient tolerated the procedure well. The quality of                        the bowel preparation was evaluated using the BBPS                        [facility] Bowel Preparation Scale) with scores of: Right                        Colon = 3, Transverse Colon = 3 and Left Colon = 3                        (entire mucosa seen well with no residual staining,                         small fragments of stool or opaque liquid). The total                        BBPS score equals 9. Findings:      There was evidence of a patent end colostomy in the mid sigmoid colon.       This was characterized by healthy appearing mucosa.      The colon (entire examined portion) appeared normal.      Hartman's pouch exam was normal, length was approximately 30cm      The perianal and digital rectal examinations were normal. Pertinent       negatives include normal sphincter tone. Impression:           - Patent end colostomy with healthy appearing mucosa in                        the mid sigmoid colon.                       - The entire examined colon is normal.                       - No specimens collected. Recommendation:       - Discharge patient to home.                       - Resume previous diet today.                       - Continue present medications.                       - Repeat colonoscopy in 10 years for surveillance.                       - Follow up with Dr Rosana Hoes for ostomy takedown Procedure Code(s):    --- Professional ---                       [phone], Colonoscopy through stoma; diagnostic,  including                        collection of specimen(s) by brushing or washing, when                        performed (separate procedure) Diagnosis Code(s):    --- Professional ---                       Z93.3, Colostomy status CPT copyright 2016 American Medical Association. All rights reserved. The codes documented in this report are preliminary and upon coder review may  be revised to meet current compliance requirements. Dr. Ulyess Mort Lin Landsman MD, MD 07/21/2017 4:51:36 PM This report has been signed electronically. Number of Addenda: 0 Note Initiated On: 07/21/2017 4:14 PM Scope Withdrawal Time: 0 hours 11 minutes 26 seconds  Total Procedure Duration: 0 hours 15 minutes 11 seconds       [facility]

## 2017-07-21 NOTE — H&P (Signed)
Cephas Darby, MD [address]  [place]  [place], [place] [address]  Main: [phone]  Fax: [phone] Pager: [phone]  Primary Care Physician:  Patient, No Pcp Per Primary Gastroenterologist:  Dr. Cephas Darby  Pre-Procedure History & Physical: HPI:  Dana West is a 42 y.o. female is here for an colonoscopy.   Past Medical History:  Diagnosis Date  . Abnormal uterine bleeding 01/12/2015  . 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 Intramural leiomyoma of uterus 01/11/2015  . Menometrorrhagia 04/18/2014  . Obesity (BMI 35.0-39.9 without comorbidity)   . Submucous leiomyoma of uterus 04/18/2014  . Subserous leiomyoma of uterus 03/16/2017    Past Surgical History:  Procedure Laterality Date  . ABDOMINAL HYSTERECTOMY  2017  . COLECTOMY WITH COLOSTOMY CREATION/HARTMANN PROCEDURE N/A 04/03/2017   Procedure: SIGMOID COLECTOMY WITH COLOSTOMY CREATION/HARTMANN PROCEDURE;  Surgeon: Vickie Epley, MD;  Location: [facility] ORS;  Service: General;  Laterality: N/A;  . UTERINE FIBROID SURGERY      Prior to Admission medications   Medication Sig Start Date End Date Taking? Authorizing Provider  polyethylene glycol (GOLYTELY) 236 g solution Drink one 8 oz glass every 20 to 30 mins until stools are clear starting at 5:00pm on 07/20/17. 07/16/17   Lin Landsman, MD    Allergies as of 07/20/2017 - Review Complete 07/20/2017  Allergen Reaction Noted  . Hydrocodone-acetaminophen Hives 01/12/2015  . Hydrocodone Hives 04/15/2017  . Raspberry Nausea And Vomiting and Swelling 12/08/2014  . Valium [diazepam] Hives 08/26/2015    Family History  Problem Relation Age of Onset  . Alcohol abuse Neg Hx   . Arthritis Neg Hx   . Asthma Neg Hx   . Birth defects Neg Hx   . Cancer Neg Hx   . COPD Neg Hx   . Depression Neg Hx   . Diabetes Neg Hx   . Drug abuse Neg Hx   . Early death  Neg Hx   . Hearing loss Neg Hx   . Heart disease Neg Hx   . Hyperlipidemia Neg Hx   . Hypertension Neg Hx   . Kidney disease Neg Hx   . Learning disabilities Neg Hx   . Mental illness Neg Hx   . Mental retardation Neg Hx   . Miscarriages / Stillbirths Neg Hx   . Stroke Neg Hx   . Vision loss Neg Hx   . Varicose Veins Neg Hx     Social History   Socioeconomic History  . Marital status: Single    Spouse name: Not on file  . Number of children: Not on file  . Years of education: Not on file  . Highest education level: Not on file  Social Needs  . Financial resource strain: Not on file  . Food insecurity - worry: Not on file  . Food insecurity - inability: Not on file  . Transportation needs - medical: Not on file  . Transportation needs - non-medical: Not on file  Occupational History  . Not on file  Tobacco Use  . Smoking status: Current Some Day Smoker    Packs/day: 0.25  . Smokeless tobacco: Never Used  Substance and Sexual Activity  . Alcohol use: No    Comment: seldom  . Drug use: No  . Sexual activity: No  Other Topics Concern  . Not on file  Social History Narrative  . Not on file  Review of Systems: See HPI, otherwise negative ROS  Physical Exam: BP 130/74   Pulse 74   Temp 98.2 F (36.8 C) (Tympanic)   Resp 20   Ht 5\' 7"  (1.702 m)   Wt 220 lb (99.8 kg)   LMP 01/28/2015 (Exact Date)   SpO2 100%   BMI 34.46 kg/m  General:   Alert,  pleasant and cooperative in NAD Head:  Normocephalic and atraumatic. Neck:  Supple; no masses or thyromegaly. Lungs:  Clear throughout to auscultation.    Heart:  Regular rate and rhythm. Abdomen:  Soft, nontender and nondistended. Normal bowel sounds, without guarding, and without rebound.   Neurologic:  Alert and  oriented x4;  grossly normal neurologically.  Impression/Plan: Lazara Stephaine Breshears is here for an colonoscopy to be performed for h/o perforated diverticulitis, prior to colostomy takedown  Risks,  benefits, limitations, and alternatives regarding  colonoscopy have been reviewed with the patient.  Questions have been answered.  All parties agreeable.   Sherri Sear, MD  07/21/2017, 3:27 PM

## 2017-07-21 NOTE — Anesthesia Preprocedure Evaluation (Signed)
Anesthesia Evaluation  Patient identified by MRN, date of birth, ID band Patient awake    Reviewed: Allergy & Precautions, NPO status , Patient's Chart, lab work & pertinent test results  History of Anesthesia Complications Negative for: history of anesthetic complications  Airway Mallampati: III       Dental   Pulmonary neg sleep apnea, neg COPD, Current Smoker,           Cardiovascular (-) hypertension(-) Past MI and (-) CHF (-) dysrhythmias (-) Valvular Problems/Murmurs     Neuro/Psych neg Seizures    GI/Hepatic Neg liver ROS, neg GERD  ,  Endo/Other  neg diabetes  Renal/GU negative Renal ROS     Musculoskeletal   Abdominal   Peds  Hematology   Anesthesia Other Findings   Reproductive/Obstetrics                             Anesthesia Physical Anesthesia Plan  ASA: II  Anesthesia Plan: General   Post-op Pain Management:    Induction: Intravenous  PONV Risk Score and Plan: 2 and TIVA and Propofol infusion  Airway Management Planned: Nasal Cannula  Additional Equipment:   Intra-op Plan:   Post-operative Plan:   Informed Consent: I have reviewed the patients History and Physical, chart, labs and discussed the procedure including the risks, benefits and alternatives for the proposed anesthesia with the patient or authorized representative who has indicated his/her understanding and acceptance.     Plan Discussed with:   Anesthesia Plan Comments:         Anesthesia Quick Evaluation

## 2017-07-21 NOTE — Anesthesia Postprocedure Evaluation (Signed)
Anesthesia Post Note  Patient: Dana West  Procedure(s) Performed: COLONOSCOPY WITH PROPOFOL (N/A )  Patient location during evaluation: Endoscopy Anesthesia Type: General Level of consciousness: awake and alert Pain management: pain level controlled Vital Signs Assessment: post-procedure vital signs reviewed and stable Respiratory status: spontaneous breathing and respiratory function stable Cardiovascular status: stable Anesthetic complications: no     Last Vitals:  Vitals:   07/21/17 1457 07/21/17 1657  BP: 130/74 (!) 117/46  Pulse: 74 79  Resp: 20 18  Temp: 36.8 C (!) 36.3 C  SpO2: 100% 100%    Last Pain:  Vitals:   07/21/17 1657  TempSrc: Tympanic                 Beverly Suriano K

## 2017-07-21 NOTE — Anesthesia Post-op Follow-up Note (Signed)
Anesthesia QCDR form completed.        

## 2017-07-22 ENCOUNTER — Other Ambulatory Visit: Payer: Self-pay

## 2017-07-22 ENCOUNTER — Telehealth: Payer: Self-pay

## 2017-07-22 ENCOUNTER — Telehealth: Payer: Self-pay | Admitting: General Practice

## 2017-07-22 ENCOUNTER — Ambulatory Visit: Admission: RE | Admit: 2017-07-22 | Payer: 59 | Source: Ambulatory Visit

## 2017-07-22 ENCOUNTER — Ambulatory Visit: Payer: 59

## 2017-07-22 ENCOUNTER — Encounter: Payer: Self-pay | Admitting: Gastroenterology

## 2017-07-22 NOTE — Telephone Encounter (Signed)
Left message with patient's friend to have her to give our office a call regarding her missed appointment. She verbalized understanding.

## 2017-07-22 NOTE — Telephone Encounter (Signed)
Patient is calling asking if her results have came in, and if so would like to know when she can schedule her appointment to come in and see Dr. Rosana Hoes for the reversal please call patient and advise.

## 2017-07-23 NOTE — Telephone Encounter (Signed)
I have called patient back. Spoke with niece that answered the phone. She states that the patient is not available. She gave me her sister's number to call. She stated that she was with her sister at this time due to a flat tire. No answer when calling. I have left a message on the voicemail.   Patient had colonoscopy on 07/21/17-no report at this time is available. Patient was to have a barium enema on 07/22/17. Patient was a no show. She will need to have this rescheduled and completed before making an appointment with Dr Rosana Hoes to discuss surgery.   Chanel, CMA has put in orders to have this rescheduled. Once patient calls back we will reschedule this. I have left a message to call back to speak with myself or Chanel if she is available.

## 2017-07-23 NOTE — Telephone Encounter (Signed)
Patient has called back and was informed of her appointment for the Barium Enema. Patient was informed that the appointment is 07/28/17 and to arrive at the [place] [facility] at 9:45am. Patient informed to pick up her prep kit at the [facility] mall[facility] desk before the end of the week for her barium enema and to be NPO after midnight prior to testing.   I also made a follow up appointment for the patient to see Dr Rosana Hoes on 07/30/17 to discuss results and discuss a surgery date.   Patient understands all information.   Patient also states that she possibly will be getting a new cell phone today. I suggested to please call us with her new contact information. At this time she states that it is ok to call her sisters cell phone and leave a voicemail if necessary. [phone].

## 2017-07-28 ENCOUNTER — Ambulatory Visit
Admission: RE | Admit: 2017-07-28 | Discharge: 2017-07-28 | Disposition: A | Discharge status: Home / Self Care | Payer: 59 | Source: Ambulatory Visit | Attending: Surgery | Admitting: Surgery

## 2017-07-28 DIAGNOSIS — Z933 Colostomy status: Secondary | ICD-10-CM | POA: Diagnosis present

## 2017-07-28 DIAGNOSIS — K5732 Diverticulitis of large intestine without perforation or abscess without bleeding: Secondary | ICD-10-CM | POA: Insufficient documentation

## 2017-07-30 ENCOUNTER — Ambulatory Visit (INDEPENDENT_AMBULATORY_CARE_PROVIDER_SITE_OTHER): Payer: 59 | Admitting: Surgery

## 2017-07-30 ENCOUNTER — Encounter: Payer: Self-pay | Admitting: Surgery

## 2017-07-30 VITALS — BP 138/70 | HR 61 | Temp 98.1°F | Ht 67.0 in | Wt 218.0 lb

## 2017-07-30 DIAGNOSIS — Z933 Colostomy status: Secondary | ICD-10-CM | POA: Diagnosis not present

## 2017-07-30 MED ORDER — FLEET ENEMA 7-19 GM/118ML RE ENEM: ENEMA | RECTAL | 0 refills | Status: DC

## 2017-07-30 MED ORDER — NEOMYCIN SULFATE 500 MG PO TABS
ORAL_TABLET | ORAL | 0 refills | Status: DC
Start: 2017-07-30 — End: 2017-08-13

## 2017-07-30 MED ORDER — ERYTHROMYCIN BASE 500 MG PO TABS: ORAL_TABLET | ORAL | 0 refills | Status: DC

## 2017-07-30 MED ORDER — BISACODYL 5 MG PO TBEC: DELAYED_RELEASE_TABLET | ORAL | 0 refills | Status: DC

## 2017-07-30 MED ORDER — POLYETHYLENE GLYCOL 3350 17 GM/SCOOP PO POWD: ORAL | 0 refills | Status: DC

## 2017-07-30 NOTE — Patient Instructions (Signed)
We have spoken today about reversing your Ostomy. You are requesting to have this done.  We will arrange this to be done on 3/7 by Dr. Rosana Hoes at [facility].   Plan on being in the hospital between 5-7 days after surgery. You will be started on a liquid diet and then advanced as tolerated prior to going home.  If you have any disability or FMLA paperwork that needs to be filled out for your employer, please bring this in prior to surgery and it will be filled out upon your discharge from the hospital. We can give you a note anticipating your surgery date. If your employer is in need of this, please let us know.  To prep for your surgery, You will need to complete a bowel prep, 2 antibiotics, and a fleets enema prior to surgery. Your antibiotics are Neomycin and Erythromycin and these will be taken at 8am, 2pm, 8pm on the day of your prep. (Please see the Bowel sheet provided)  Please see your [facility]) Pre-Care Sheet for more information. If you have any questions, please call our office and ask for a nurse.  End Colostomy Reversal An end colostomy reversal is surgery that reverses an end colostomy. The large intestine is disconnected from the opening in the abdomen (stoma). Then it is reconnected to the large intestine inside the body. A stoma and pouch are no longer needed. Bowel movements can resume through the rectum. LET [facility] CARE PROVIDER KNOW ABOUT:  Allergies to food or medicine.  Medicines taken, including vitamins, health supplements, herbs, eye drops, over-the-counter medicines, and creams.  Use of steroids (by mouth or creams).  Previous problems with anesthetics or numbing medicines.  History of bleeding problems or blood clots.  Previous surgery.  Other health problems, including diabetes and kidney problems.  Possibility of pregnancy, if this applies. RISKS AND COMPLICATIONS General surgical complications may include the following:  Reaction to anesthetics.  Damage to  surrounding nerves, tissues, or structures.  Blood clot.  Bleeding.  Scarring. Specific risks for colostomy reversal, while rare, may include:  Intestinal paralysis (ileus). This is a normal part of recovery. It usually goes away in 3-7 days. However, it can last longer in some people.  Leaking at the joined part of the intestine (anastomotic leak).  Infection of the surgical cut (incision) or the place where the stoma was located.  A collection of pus (abscess) in the abdomen or pelvis.  Intestinal blockage.  Narrowing at the joined part of the intestine (stricture).  Urinary and sexual dysfunction. BEFORE THE PROCEDURE It is important to follow your health care provider's instructions prior to your procedure. This will help you to avoid complications. Steps before your procedure may include:  A physical exam, rectal exam, X-rays, colonoscopy, and other procedures.  Chemotherapy or radiation therapy, if the stoma was created due to cancer.  A review of the procedure, the anesthetic being used, and what to expect after the procedure. You may be asked to:  Stop taking certain medicines for several days prior to your procedure. These may include blood thinners (such as aspirin).  Take certain medicines, such as antibiotics or stool softeners.  Avoid eating and drinking after midnight the night before the procedure. This will help you to avoid complications from the anesthetic.  Quit smoking. Smoking increases the chances of a healing problem after your procedure. PROCEDURE You will be given medicine that makes you sleep (general anesthetic). The procedure may be done as open surgery, with a  large incision. It may also be done as laparoscopic surgery, with several smaller incisions. The surgeon will stitch or staple the intestine ends back together. This surgery takes several hours. AFTER THE PROCEDURE  You will be given pain medicine.  Slowly increase your diet and movement  as directed by your health care provider.  You should arrange for someone to help you with activities at home while you recover.   This information is not intended to replace advice given to you by your health care provider. Make sure you discuss any questions you have with your health care provider.   Document Released: 08/25/2011 Document Revised: 10/17/2014 Document Reviewed: 08/25/2011 Elsevier Interactive Patient Education [employer].

## 2017-08-02 ENCOUNTER — Encounter: Payer: Self-pay | Admitting: Surgery

## 2017-08-02 NOTE — Progress Notes (Signed)
Surgical Clinic Progress/Follow-up Note   HPI:  42 y.o. 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 Patient reports daily soft non-bloody BM's via colostomy without constipation or loose BM's, denies abdominal pain, N/V, colostomy difficulty, fever/chills, CP, or SOB. She has also underwent colonoscopy and barium enema studies since her prior follow-up appointment one month ago. She also says she has been trying to lose weight by dietary changes and exercise prior to reversal of her colostomy.  Review of Systems:  Constitutional: denies any other weight loss, fever, chills, or sweats  Eyes: denies any other vision changes, history of eye injury  ENT: denies sore throat, hearing problems  Respiratory: denies shortness of breath, wheezing  Cardiovascular: denies chest pain, palpitations  Gastrointestinal: abdominal pain, N/V, and bowel function as per HPI Musculoskeletal: denies any other joint pains or cramps  Skin: Denies any other rashes or skin discolorations  Neurological: denies any other headache, dizziness, weakness  Psychiatric: denies any other depression, anxiety  All other review of systems: otherwise negative   Vital Signs:  BP 138/70   Pulse 61   Temp 98.1 F (36.7 C) (Oral)   Ht 5\' 7"  (1.702 m)   Wt 218 lb (98.9 kg)   LMP 01/28/2015 (Exact Date)   BMI 34.14 kg/m    Physical Exam:  Constitutional:  -- ObeseNormal body habitus  -- Awake, alert, and oriented x3  Eyes:  -- Pupils equally round and reactive to light  -- No scleral icterus  Ear, nose, throat:  -- No jugular venous distension  -- No nasal drainage, bleeding Pulmonary:  -- No crackles -- Equal breath sounds bilaterally -- Breathing non-labored at rest Cardiovascular:  -- S1, S2 present  -- No pericardial rubs  Gastrointestinal:   -- Soft, nontender, non-distended, no guarding/rebound  -- Pink, healthy, albeit skin-level, Left-sided colostomy with gas and stool in colostomy bag -- Well-healed midline laparotomy post-surgical skin incision -- No abdominal masses appreciated, pulsatile or otherwise Musculoskeletal / Integumentary:  -- Wounds or skin discoloration: None appreciated except as described above  -- Extremities: B/L UE and LE FROM, hands and feet warm, no edema  Neurologic:  -- Motor function: intact and symmetric  -- Sensation: intact and symmetric   Laboratory studies:  CBC Latest Ref Rng & Units 04/09/2017 04/08/2017 04/07/2017  WBC 3.6 - 11.0 K/uL 12.3(H) 10.7 12.7(H)  Hemoglobin 12.0 - 16.0 g/dL 9.0(L) 9.0(L) 8.9(L)  Hematocrit 35.0 - 47.0 % 27.5(L) 28.0(L) 27.8(L)  Platelets 150 - 440 K/uL 322 316 316   CMP Latest Ref Rng & Units 04/06/2017 04/05/2017 04/03/2017  Glucose 65 - 99 mg/dL 80 95 125(H)  BUN 6 - 20 mg/dL 13 15 11   Creatinine 0.44 - 1.00 mg/dL 0.83 0.91 0.87  Sodium 135 - 145 mmol/L 134(L) 134(L) 138  Potassium 3.5 - 5.1 mmol/L 3.3(L) 3.5 2.9(L)  Chloride 101 - 111 mmol/L 102 101 104  CO2 22 - 32 mmol/L 24 23 22   Calcium 8.9 - 10.3 mg/dL 7.9(L) 8.1(L) 9.0  Total Protein 6.5 - 8.1 g/dL - - 8.7(H)  Total Bilirubin 0.3 - 1.2 mg/dL - - 0.5  Alkaline Phos 38 - 126 U/L - - 56  AST 15 - 41 U/L - - 20  ALT 14 - 54 U/L - - 12(L)   Imaging:  Colonoscopy (07/21/2017) There was evidence of a patent end colostomy in the mid  sigmoid colon. This was characterized by healthy appearing mucosa. The colon (entire examined portion) appeared normal. Hartman's pouch exam was normal, length was approximately 30cm The perianal and digital rectal examinations were normal. Pertinent negatives include normal sphincter tone.  Barium Enema (07/28/2017) - personally reviewed and discussed with patient Administered barium passed retrograde to the level of the  distal sigmoid but passed no farther. A suture  line was visible  here. A single diverticulum was observed. There was no evidence  of leakage or acute diverticulitis. Post evacuation images revealed  a small amount of retained contrast but no evidence of leakage.  Assessment:  41 y.o. yo Female with a problem list including...  Patient Active Problem List   Diagnosis Date Noted  . S/P colostomy, follow-up exam   . Diverticulitis of large intestine with perforation and abscess 04/04/2017  . Bowel perforation ([place])   . Perforated diverticulum of large intestine   . Diverticulitis 03/20/2017  . 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 Abnormal uterine bleeding 01/12/2015  . 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 Intramural leiomyoma of uterus 01/11/2015  . 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 Menometrorrhagia 04/18/2014  . 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  Plan:   - colonoscopy and barium enema results discussed with patient   - prescriptions provided for pre-reversal bowel prep, antibiotics, and enema  - all risks, benefits, and alternatives to open reversal of end-colostomy with Hartmann's rectal pouch were discussed with the patient, including anticipated takedown of splenic flexure and possibility of diverting ileostomy, considering difficulty of her initial operation for perforated diverticulitis following prolonged antibiotics for sigmoid colonic diverticulitis, all of her questions were answered to her expressed satisfaction, patient expresses she wishes to proceed, and informed consent was accordingly obtained.  - will plan for open reversal of patient's colostomy, with Dr. Genevive Bi to assist, Thursday, 3/7  - patient's intent to lose weight prior to colostomy reversal applauded and  encouraged  - anticipate return to clinic 2 weeks following above planned procedure  - instructed to call office if any questions or concerns  All of the above recommendations were discussed with the patient, and all of patient's questions were answered to her expressed satisfaction.  -- Marilynne Drivers Rosana Hoes, MD, [place]: [facility] - Partnering for exceptional care. Office: [phone]

## 2017-08-02 NOTE — H&P (View-Only) (Signed)
Surgical Clinic Progress/Follow-up Note   HPI:  42 y.o. 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 Patient reports daily soft non-bloody BM's via colostomy without constipation or loose BM's, denies abdominal pain, N/V, colostomy difficulty, fever/chills, CP, or SOB. She has also underwent colonoscopy and barium enema studies since her prior follow-up appointment one month ago. She also says she has been trying to lose weight by dietary changes and exercise prior to reversal of her colostomy.  Review of Systems:  Constitutional: denies any other weight loss, fever, chills, or sweats  Eyes: denies any other vision changes, history of eye injury  ENT: denies sore throat, hearing problems  Respiratory: denies shortness of breath, wheezing  Cardiovascular: denies chest pain, palpitations  Gastrointestinal: abdominal pain, N/V, and bowel function as per HPI Musculoskeletal: denies any other joint pains or cramps  Skin: Denies any other rashes or skin discolorations  Neurological: denies any other headache, dizziness, weakness  Psychiatric: denies any other depression, anxiety  All other review of systems: otherwise negative   Vital Signs:  BP 138/70   Pulse 61   Temp 98.1 F (36.7 C) (Oral)   Ht 5\' 7"  (1.702 m)   Wt 218 lb (98.9 kg)   LMP 01/28/2015 (Exact Date)   BMI 34.14 kg/m    Physical Exam:  Constitutional:  -- ObeseNormal body habitus  -- Awake, alert, and oriented x3  Eyes:  -- Pupils equally round and reactive to light  -- No scleral icterus  Ear, nose, throat:  -- No jugular venous distension  -- No nasal drainage, bleeding Pulmonary:  -- No crackles -- Equal breath sounds bilaterally -- Breathing non-labored at rest Cardiovascular:  -- S1, S2 present  -- No pericardial rubs  Gastrointestinal:   -- Soft, nontender, non-distended, no guarding/rebound  -- Pink, healthy, albeit skin-level, Left-sided colostomy with gas and stool in colostomy bag -- Well-healed midline laparotomy post-surgical skin incision -- No abdominal masses appreciated, pulsatile or otherwise Musculoskeletal / Integumentary:  -- Wounds or skin discoloration: None appreciated except as described above  -- Extremities: B/L UE and LE FROM, hands and feet warm, no edema  Neurologic:  -- Motor function: intact and symmetric  -- Sensation: intact and symmetric   Laboratory studies:  CBC Latest Ref Rng & Units 04/09/2017 04/08/2017 04/07/2017  WBC 3.6 - 11.0 K/uL 12.3(H) 10.7 12.7(H)  Hemoglobin 12.0 - 16.0 g/dL 9.0(L) 9.0(L) 8.9(L)  Hematocrit 35.0 - 47.0 % 27.5(L) 28.0(L) 27.8(L)  Platelets 150 - 440 K/uL 322 316 316   CMP Latest Ref Rng & Units 04/06/2017 04/05/2017 04/03/2017  Glucose 65 - 99 mg/dL 80 95 125(H)  BUN 6 - 20 mg/dL 13 15 11   Creatinine 0.44 - 1.00 mg/dL 0.83 0.91 0.87  Sodium 135 - 145 mmol/L 134(L) 134(L) 138  Potassium 3.5 - 5.1 mmol/L 3.3(L) 3.5 2.9(L)  Chloride 101 - 111 mmol/L 102 101 104  CO2 22 - 32 mmol/L 24 23 22   Calcium 8.9 - 10.3 mg/dL 7.9(L) 8.1(L) 9.0  Total Protein 6.5 - 8.1 g/dL - - 8.7(H)  Total Bilirubin 0.3 - 1.2 mg/dL - - 0.5  Alkaline Phos 38 - 126 U/L - - 56  AST 15 - 41 U/L - - 20  ALT 14 - 54 U/L - - 12(L)   Imaging:  Colonoscopy (07/21/2017) There was evidence of a patent end colostomy in the mid  sigmoid colon. This was characterized by healthy appearing mucosa. The colon (entire examined portion) appeared normal. Hartman's pouch exam was normal, length was approximately 30cm The perianal and digital rectal examinations were normal. Pertinent negatives include normal sphincter tone.  Barium Enema (07/28/2017) - personally reviewed and discussed with patient Administered barium passed retrograde to the level of the  distal sigmoid but passed no farther. A suture  line was visible  here. A single diverticulum was observed. There was no evidence  of leakage or acute diverticulitis. Post evacuation images revealed  a small amount of retained contrast but no evidence of leakage.  Assessment:  42 y.o. yo Female with a problem list including...  Patient Active Problem List   Diagnosis Date Noted  . S/P colostomy, follow-up exam   . Diverticulitis of large intestine with perforation and abscess 04/04/2017  . Bowel perforation ([place])   . Perforated diverticulum of large intestine   . Diverticulitis 03/20/2017  . 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 Abnormal uterine bleeding 01/12/2015  . 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 Intramural leiomyoma of uterus 01/11/2015  . 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 Menometrorrhagia 04/18/2014  . 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  Plan:   - colonoscopy and barium enema results discussed with patient   - prescriptions provided for pre-reversal bowel prep, antibiotics, and enema  - all risks, benefits, and alternatives to open reversal of end-colostomy with Hartmann's rectal pouch were discussed with the patient, including anticipated takedown of splenic flexure and possibility of diverting ileostomy, considering difficulty of her initial operation for perforated diverticulitis following prolonged antibiotics for sigmoid colonic diverticulitis, all of her questions were answered to her expressed satisfaction, patient expresses she wishes to proceed, and informed consent was accordingly obtained.  - will plan for open reversal of patient's colostomy, with Dr. Genevive Bi to assist, Thursday, 3/7  - patient's intent to lose weight prior to colostomy reversal applauded and  encouraged  - anticipate return to clinic 2 weeks following above planned procedure  - instructed to call office if any questions or concerns  All of the above recommendations were discussed with the patient, and all of patient's questions were answered to her expressed satisfaction.  -- Marilynne Drivers Rosana Hoes, MD, [place]: [facility] - Partnering for exceptional care. Office: [phone]

## 2017-08-04 ENCOUNTER — Telehealth: Payer: Self-pay | Admitting: Surgery

## 2017-08-04 NOTE — Telephone Encounter (Signed)
Pt advised of pre op date/time and sx date. Sx: 08/20/17 with Dr Rayburn Go Genevive Bi assisting--colostomy reversal.  Pre op: 08/13/17 @ 9:00am--office interview.   Patient made aware to call [phone], between 1-3:00pm the day before surgery, to find out what time to arrive.

## 2017-08-13 ENCOUNTER — Encounter
Admission: RE | Admit: 2017-08-13 | Discharge: 2017-08-13 | Disposition: A | Discharge status: Home / Self Care | Payer: 59 | Source: Ambulatory Visit | Attending: Surgery | Admitting: Surgery

## 2017-08-13 ENCOUNTER — Other Ambulatory Visit: Payer: Self-pay

## 2017-08-13 ENCOUNTER — Ambulatory Visit
Admission: RE | Admit: 2017-08-13 | Discharge: 2017-08-13 | Disposition: A | Discharge status: Home / Self Care | Payer: 59 | Source: Ambulatory Visit | Attending: Surgery | Admitting: Surgery

## 2017-08-13 ENCOUNTER — Other Ambulatory Visit: Payer: Self-pay | Admitting: Surgery

## 2017-08-13 DIAGNOSIS — Z01812 Encounter for preprocedural laboratory examination: Secondary | ICD-10-CM | POA: Insufficient documentation

## 2017-08-13 DIAGNOSIS — Z933 Colostomy status: Secondary | ICD-10-CM | POA: Insufficient documentation

## 2017-08-13 DIAGNOSIS — Z01810 Encounter for preprocedural cardiovascular examination: Secondary | ICD-10-CM | POA: Insufficient documentation

## 2017-08-13 DIAGNOSIS — Z01811 Encounter for preprocedural respiratory examination: Secondary | ICD-10-CM

## 2017-08-13 HISTORY — DX: Anemia, unspecified: D64.9

## 2017-08-13 LAB — COMPREHENSIVE METABOLIC PANEL
ALT: 13 U/L — ABNORMAL LOW (ref 14–54)
AST: 17 U/L (ref 15–41)
Albumin: 3.8 g/dL (ref 3.5–5.0)
Alkaline Phosphatase: 91 U/L (ref 38–126)
Anion gap: 8 (ref 5–15)
BUN: 16 mg/dL (ref 6–20)
CO2: 24 mmol/L (ref 22–32)
Calcium: 9.3 mg/dL (ref 8.9–10.3)
Chloride: 106 mmol/L (ref 101–111)
Creatinine, Ser: 0.78 mg/dL (ref 0.44–1.00)
GFR calc Af Amer: >60 mL/min (ref >60)
GFR calc non Af Amer: >60 mL/min (ref >60)
GLUCOSE: 90 mg/dL (ref 65–99)
POTASSIUM: 4.2 mmol/L (ref 3.5–5.1)
Sodium: 138 mmol/L (ref 135–145)
Total Bilirubin: 0.5 mg/dL (ref 0.3–1.2)
Total Protein: 8.3 g/dL — ABNORMAL HIGH (ref 6.5–8.1)

## 2017-08-13 LAB — CBC WITH DIFFERENTIAL/PLATELET
BASOS ABS: 0 10*3/uL (ref 0–0.1)
BASOS PCT: 1 %
Eosinophils Absolute: 0.1 10*3/uL (ref 0–0.7)
Eosinophils Relative: 2 %
HCT: 40.2 % (ref 35.0–47.0)
Hemoglobin: 13.1 g/dL (ref 12.0–16.0)
Lymphocytes Relative: 21 %
Lymphs Abs: 1.2 10*3/uL (ref 1.0–3.6)
MCH: 28.5 pg (ref 26.0–34.0)
MCHC: 32.5 g/dL (ref 32.0–36.0)
MCV: 87.5 fL (ref 80.0–100.0)
MONO ABS: 0.6 10*3/uL (ref 0.2–0.9)
Monocytes Relative: 10 %
NEUTROS ABS: 4 10*3/uL (ref 1.4–6.5)
Neutrophils Relative %: 66 %
PLATELETS: 230 10*3/uL (ref 150–440)
RBC: 4.6 MIL/uL (ref 3.80–5.20)
RDW: 15.8 % — AB (ref 11.5–14.5)
WBC: 5.9 10*3/uL (ref 3.6–11.0)

## 2017-08-13 MED ORDER — ERYTHROMYCIN BASE 500 MG PO TABS: ORAL_TABLET | ORAL | 0 refills | Status: DC

## 2017-08-13 MED ORDER — POLYETHYLENE GLYCOL 3350 17 GM/SCOOP PO POWD: ORAL | 0 refills | Status: DC

## 2017-08-13 MED ORDER — BISACODYL 5 MG PO TBEC: DELAYED_RELEASE_TABLET | ORAL | 0 refills | Status: DC

## 2017-08-13 MED ORDER — NEOMYCIN SULFATE 500 MG PO TABS: ORAL_TABLET | ORAL | 0 refills | Status: DC

## 2017-08-13 MED ORDER — FLEET ENEMA 7-19 GM/118ML RE ENEM: ENEMA | RECTAL | 0 refills | Status: DC

## 2017-08-13 NOTE — Telephone Encounter (Signed)
Call made to patient at this time. I advise her that the medication was sent in to the [facility] on [address]. She asked if I could get them sent to [facility] in [place]. I verbalized that it was not a problem. I kept her on the phone to ensure that I had the correct [facility] and she verbalized that I did. I advise her to give our office a call if she was still unable to pick up her medication. Patient verbalized understanding.

## 2017-08-13 NOTE — Telephone Encounter (Signed)
Patients calling said she has a few medications that still haven't been called into the pharmacy. Patient is using [facility] on [address]. Please call patient and advise.

## 2017-08-13 NOTE — Patient Instructions (Signed)
Your procedure is scheduled on: August 20, 2017 THURSDAY Report to Same Day Surgery on the 2nd floor in the [facility]. To find out your arrival time, please call [phone] between 1PM - 3PM on: August 19, 2017   REMEMBER: Instructions that are not followed completely may result in serious medical risk, up to and including death; or upon the discretion of your surgeon and anesthesiologist your surgery may need to be rescheduled.   FOLLOW INSTRUCTIONS FOR COLON SURGERY PREP FOR LAST SOLID MEAL AND LAST LIQUID AND BOWEL PREP GIVEN AT DOCTOR'S OFFICE. NOT No Alcohol for 24 hours before or after surgery.  No Smoking including e-cigarettes for 24 hours prior to surgery. No chewable tobacco products for at least 6 hours prior to surgery. No nicotine patches on the day of surgery.  On the morning of surgery brush your teeth with toothpaste and water, you may rinse your mouth with mouthwash if you wish. Do not swallow any  toothpaste of mouthwash.  Notify your doctor if there is any change in your medical condition (cold, fever, infection).  Do not wear jewelry, make-up, hairpins, clips or nail polish.  Do not wear lotions, powders, or perfumes. You may NOTwear deodorant.  Do not shave 48 hours prior to surgery. Men may shave face and neck.  Contacts and dentures may not be worn into surgery.  Do not bring valuables to the hospital. [facility] is not responsible for any belongings or valuables.  TAKE THESE MEDICATIONS THE MORNING OF SURGERY: NONE  Use CHG Soap or wipes as directed on instruction sheet.  FOLLOW BOWEL PREP INSTRUCTIONS GIVEN AT OFFICE  Stop Anti-inflammatories such as Advil, Aleve, Ibuprofen, Motrin, Naproxen, Naprosyn, Goodie powder, or aspirin products. (May take Tylenol or Acetaminophen if needed.)  Stop ANY OVER THE COUNTER supplements until after surgery. (May continue Vitamin D, Vitamin B, and multivitamin.)  If you are being admitted to the hospital  overnight, leave your suitcase in the car. After surgery it may be brought to your room.  If you are being discharged the day of surgery, you will not be allowed to drive home. You will need someone to drive you home and stay with you that night.   If you are taking public transportation, you will need to have a responsible adult to with you.  Please call the number above if you have any questions about these instructions.

## 2017-08-18 ENCOUNTER — Telehealth: Payer: Self-pay | Admitting: Surgery

## 2017-08-18 NOTE — Telephone Encounter (Signed)
Patient returned call and asked how many of the Neomycin and Erythromycin tablets she is to take and is she to mix the miralax into the Gatorade? I advised patient she is to take two of each antibiotic at the stated times on her paperwork taking a total of six of each tablets by the end of the day. I also advised her to pour the entire container into the Gatorade and to consume it all by end of day. Patient verbalized understanding.

## 2017-08-18 NOTE — Telephone Encounter (Signed)
Patient is calling and has questions about her medication miralax also is asking about gatorade. Please call patient and advise.

## 2017-08-18 NOTE — Telephone Encounter (Signed)
Returned call to patient at this time. Left a message for her to call back.

## 2017-08-19 ENCOUNTER — Telehealth: Payer: Self-pay | Admitting: Surgery

## 2017-08-19 MED ORDER — SODIUM CHLORIDE 0.9 % IV SOLN
2.0000 g | INTRAVENOUS | Status: AC
  Administered 2017-08-20: 2 g via INTRAVENOUS
  Filled 2017-08-19: qty 2

## 2017-08-19 NOTE — Telephone Encounter (Signed)
Returned call to patient at this time. I verbalized to her that being nausea is normal when taking the antibiotic, however if she starts to experience anything worse than to give our office a call. She verbalized understanding at this time.

## 2017-08-19 NOTE — Telephone Encounter (Signed)
Patients calling said she has surgery tomorrow, she said she is on some antibiotics and feels nausea, patient is asking if this is normal. Please call patient and advise.

## 2017-08-20 ENCOUNTER — Inpatient Hospital Stay
Admission: RE | Admit: 2017-08-20 | Discharge: 2017-08-24 | DRG: 331 | Disposition: A | Discharge status: Home / Self Care | Payer: 59 | Source: Ambulatory Visit | Attending: Surgery | Admitting: Surgery

## 2017-08-20 ENCOUNTER — Inpatient Hospital Stay: Payer: 59 | Admitting: Certified Registered"

## 2017-08-20 ENCOUNTER — Encounter
Admission: RE | Disposition: A | Discharge status: Home / Self Care | Payer: Self-pay | Source: Ambulatory Visit | Attending: Surgery

## 2017-08-20 ENCOUNTER — Other Ambulatory Visit: Payer: Self-pay

## 2017-08-20 ENCOUNTER — Encounter: Payer: Self-pay | Admitting: *Deleted

## 2017-08-20 DIAGNOSIS — Z90710 Acquired absence of both cervix and uterus: Secondary | ICD-10-CM

## 2017-08-20 DIAGNOSIS — Z433 Encounter for attention to colostomy: Secondary | ICD-10-CM | POA: Diagnosis present

## 2017-08-20 DIAGNOSIS — Z933 Colostomy status: Secondary | ICD-10-CM | POA: Diagnosis not present

## 2017-08-20 HISTORY — PX: COLOSTOMY REVERSAL: SHX5782

## 2017-08-20 SURGERY — COLOSTOMY REVERSAL
Anesthesia: General | Wound class: Contaminated

## 2017-08-20 MED ORDER — GLYCOPYRROLATE 0.2 MG/ML IJ SOLN
INTRAMUSCULAR | Status: AC
  Filled 2017-08-20: qty 1

## 2017-08-20 MED ORDER — GLYCOPYRROLATE 0.2 MG/ML IJ SOLN
INTRAMUSCULAR | Status: DC | PRN
  Administered 2017-08-20: 0.1 mg via INTRAVENOUS

## 2017-08-20 MED ORDER — ENOXAPARIN SODIUM 40 MG/0.4ML ~~LOC~~ SOLN
40.0000 mg | SUBCUTANEOUS | Status: DC
  Administered 2017-08-21 – 2017-08-24 (×4): 40 mg via SUBCUTANEOUS
  Filled 2017-08-20 (×4): qty 0.4

## 2017-08-20 MED ORDER — TRAMADOL-ACETAMINOPHEN 37.5-325 MG PO TABS
1.0000 | ORAL_TABLET | Freq: Four times a day (QID) | ORAL | Status: DC | PRN
  Administered 2017-08-20: 1 via ORAL
  Filled 2017-08-20: qty 2
  Filled 2017-08-20: qty 1

## 2017-08-20 MED ORDER — LIDOCAINE HCL (CARDIAC) 20 MG/ML IV SOLN
INTRAVENOUS | Status: DC | PRN
  Administered 2017-08-20: 40 mg via INTRAVENOUS

## 2017-08-20 MED ORDER — PANTOPRAZOLE SODIUM 40 MG IV SOLR
40.0000 mg | Freq: Every day | INTRAVENOUS | Status: DC
  Administered 2017-08-20 – 2017-08-23 (×4): 40 mg via INTRAVENOUS
  Filled 2017-08-20 (×4): qty 40

## 2017-08-20 MED ORDER — FENTANYL CITRATE (PF) 100 MCG/2ML IJ SOLN
25.0000 ug | INTRAMUSCULAR | Status: DC | PRN
  Administered 2017-08-20 (×2): 25 ug via INTRAVENOUS

## 2017-08-20 MED ORDER — ROCURONIUM BROMIDE 50 MG/5ML IV SOLN
INTRAVENOUS | Status: AC
  Filled 2017-08-20: qty 1

## 2017-08-20 MED ORDER — SUGAMMADEX SODIUM 200 MG/2ML IV SOLN
INTRAVENOUS | Status: DC | PRN
  Administered 2017-08-20: 200 mg via INTRAVENOUS

## 2017-08-20 MED ORDER — ONDANSETRON HCL 4 MG/2ML IJ SOLN
INTRAMUSCULAR | Status: AC
  Filled 2017-08-20: qty 2

## 2017-08-20 MED ORDER — ONDANSETRON HCL 4 MG/2ML IJ SOLN
INTRAMUSCULAR | Status: DC | PRN
  Administered 2017-08-20 (×2): 4 mg via INTRAVENOUS

## 2017-08-20 MED ORDER — ONDANSETRON HCL 4 MG/2ML IJ SOLN
INTRAMUSCULAR | Status: AC
  Administered 2017-08-20: 4 mg via INTRAVENOUS
  Filled 2017-08-20: qty 2

## 2017-08-20 MED ORDER — GABAPENTIN 300 MG PO CAPS
300.0000 mg | ORAL_CAPSULE | ORAL | Status: AC
  Administered 2017-08-20: 300 mg via ORAL

## 2017-08-20 MED ORDER — ONDANSETRON HCL 4 MG/2ML IJ SOLN
4.0000 mg | Freq: Four times a day (QID) | INTRAMUSCULAR | Status: DC | PRN
  Administered 2017-08-20 – 2017-08-22 (×2): 4 mg via INTRAVENOUS
  Filled 2017-08-20 (×2): qty 2

## 2017-08-20 MED ORDER — ONDANSETRON HCL 4 MG/2ML IJ SOLN
4.0000 mg | Freq: Once | INTRAMUSCULAR | Status: AC | PRN
Start: 2017-08-20 — End: 2017-08-20
  Administered 2017-08-20: 4 mg via INTRAVENOUS

## 2017-08-20 MED ORDER — HYDROMORPHONE HCL 1 MG/ML IJ SOLN
INTRAMUSCULAR | Status: AC
  Filled 2017-08-20: qty 1

## 2017-08-20 MED ORDER — LABETALOL HCL 5 MG/ML IV SOLN
INTRAVENOUS | Status: DC | PRN
  Administered 2017-08-20: 2.5 mg via INTRAVENOUS
  Administered 2017-08-20: 5 mg via INTRAVENOUS
  Administered 2017-08-20 (×5): 2.5 mg via INTRAVENOUS

## 2017-08-20 MED ORDER — CHLORHEXIDINE GLUCONATE CLOTH 2 % EX PADS: 6.0000 | MEDICATED_PAD | Freq: Once | CUTANEOUS | Status: DC

## 2017-08-20 MED ORDER — PROPOFOL 10 MG/ML IV BOLUS
INTRAVENOUS | Status: DC | PRN
  Administered 2017-08-20: 150 mg via INTRAVENOUS
  Administered 2017-08-20: 50 mg via INTRAVENOUS

## 2017-08-20 MED ORDER — DEXAMETHASONE SODIUM PHOSPHATE 10 MG/ML IJ SOLN
INTRAMUSCULAR | Status: AC
  Filled 2017-08-20: qty 1

## 2017-08-20 MED ORDER — BUPIVACAINE LIPOSOME 1.3 % IJ SUSP
INTRAMUSCULAR | Status: AC
  Filled 2017-08-20: qty 20

## 2017-08-20 MED ORDER — KCL IN DEXTROSE-NACL 20-5-0.45 MEQ/L-%-% IV SOLN
INTRAVENOUS | Status: DC
  Administered 2017-08-20 – 2017-08-22 (×3): via INTRAVENOUS
  Filled 2017-08-20 (×9): qty 1000

## 2017-08-20 MED ORDER — DEXAMETHASONE SODIUM PHOSPHATE 10 MG/ML IJ SOLN
INTRAMUSCULAR | Status: DC | PRN
  Administered 2017-08-20: 10 mg via INTRAVENOUS

## 2017-08-20 MED ORDER — FENTANYL CITRATE (PF) 100 MCG/2ML IJ SOLN
INTRAMUSCULAR | Status: AC
  Filled 2017-08-20: qty 2

## 2017-08-20 MED ORDER — MIDAZOLAM HCL 2 MG/2ML IJ SOLN
INTRAMUSCULAR | Status: AC
  Filled 2017-08-20: qty 4

## 2017-08-20 MED ORDER — KETAMINE HCL 10 MG/ML IJ SOLN
INTRAMUSCULAR | Status: DC | PRN
  Administered 2017-08-20: 50 mg via INTRAVENOUS

## 2017-08-20 MED ORDER — SODIUM CHLORIDE 0.9 % IV SOLN
1.0000 g | Freq: Two times a day (BID) | INTRAVENOUS | Status: AC
  Administered 2017-08-20 – 2017-08-21 (×2): 1 g via INTRAVENOUS
  Filled 2017-08-20 (×2): qty 1

## 2017-08-20 MED ORDER — ACETAMINOPHEN 325 MG PO TABS: 650.0000 mg | ORAL_TABLET | Freq: Four times a day (QID) | ORAL | Status: DC | PRN

## 2017-08-20 MED ORDER — PROPOFOL 10 MG/ML IV BOLUS
INTRAVENOUS | Status: AC
Start: 2017-08-20 — End: ?
  Filled 2017-08-20: qty 20

## 2017-08-20 MED ORDER — KETAMINE HCL 50 MG/ML IJ SOLN
INTRAMUSCULAR | Status: AC
  Filled 2017-08-20: qty 10

## 2017-08-20 MED ORDER — FENTANYL CITRATE (PF) 100 MCG/2ML IJ SOLN: 25.0000 ug | INTRAMUSCULAR | Status: DC | PRN

## 2017-08-20 MED ORDER — SEVOFLURANE IN SOLN
RESPIRATORY_TRACT | Status: AC
  Filled 2017-08-20: qty 250

## 2017-08-20 MED ORDER — KETOROLAC TROMETHAMINE 30 MG/ML IJ SOLN
30.0000 mg | Freq: Four times a day (QID) | INTRAMUSCULAR | Status: DC
  Administered 2017-08-20 – 2017-08-24 (×14): 30 mg via INTRAVENOUS
  Filled 2017-08-20 (×15): qty 1

## 2017-08-20 MED ORDER — ACETAMINOPHEN 650 MG RE SUPP
650.0000 mg | Freq: Four times a day (QID) | RECTAL | Status: DC | PRN
  Filled 2017-08-20: qty 1

## 2017-08-20 MED ORDER — DIPHENHYDRAMINE HCL 50 MG/ML IJ SOLN
INTRAMUSCULAR | Status: DC | PRN
  Administered 2017-08-20: 25 mg via INTRAVENOUS

## 2017-08-20 MED ORDER — FAMOTIDINE 20 MG PO TABS
20.0000 mg | ORAL_TABLET | Freq: Once | ORAL | Status: AC
  Administered 2017-08-20: 20 mg via ORAL

## 2017-08-20 MED ORDER — ONDANSETRON HCL 4 MG/2ML IJ SOLN
INTRAMUSCULAR | Status: AC
  Filled 2017-08-20: qty 4

## 2017-08-20 MED ORDER — FAMOTIDINE 20 MG PO TABS
ORAL_TABLET | ORAL | Status: AC
  Filled 2017-08-20: qty 1

## 2017-08-20 MED ORDER — BUPIVACAINE HCL (PF) 0.5 % IJ SOLN
INTRAMUSCULAR | Status: AC
  Filled 2017-08-20: qty 30

## 2017-08-20 MED ORDER — PREDNISOLONE ACETATE 1 % OP SUSP
1.0000 [drp] | Freq: Every day | OPHTHALMIC | Status: DC
  Administered 2017-08-20 – 2017-08-23 (×4): 1 [drp] via OPHTHALMIC
  Filled 2017-08-20: qty 1

## 2017-08-20 MED ORDER — DIPHENHYDRAMINE HCL 50 MG/ML IJ SOLN
INTRAMUSCULAR | Status: AC
  Filled 2017-08-20: qty 1

## 2017-08-20 MED ORDER — SODIUM CHLORIDE 0.9 % IJ SOLN
INTRAMUSCULAR | Status: AC
  Filled 2017-08-20: qty 10

## 2017-08-20 MED ORDER — ACETAMINOPHEN 500 MG PO TABS
ORAL_TABLET | ORAL | Status: AC
  Filled 2017-08-20: qty 2

## 2017-08-20 MED ORDER — MIDAZOLAM HCL 2 MG/2ML IJ SOLN
INTRAMUSCULAR | Status: DC | PRN
  Administered 2017-08-20 (×2): 2 mg via INTRAVENOUS

## 2017-08-20 MED ORDER — ROCURONIUM BROMIDE 100 MG/10ML IV SOLN
INTRAVENOUS | Status: DC | PRN
  Administered 2017-08-20: 10 mg via INTRAVENOUS
  Administered 2017-08-20: 50 mg via INTRAVENOUS
  Administered 2017-08-20 (×3): 20 mg via INTRAVENOUS
  Administered 2017-08-20 (×2): 10 mg via INTRAVENOUS

## 2017-08-20 MED ORDER — ACETAMINOPHEN 500 MG PO TABS
1000.0000 mg | ORAL_TABLET | ORAL | Status: AC
  Administered 2017-08-20: 1000 mg via ORAL

## 2017-08-20 MED ORDER — LACTATED RINGERS IV SOLN
INTRAVENOUS | Status: DC
  Administered 2017-08-20 (×6): via INTRAVENOUS

## 2017-08-20 MED ORDER — ONDANSETRON 4 MG PO TBDP: 4.0000 mg | ORAL_TABLET | Freq: Four times a day (QID) | ORAL | Status: DC | PRN

## 2017-08-20 MED ORDER — HYDROMORPHONE HCL 1 MG/ML IJ SOLN
INTRAMUSCULAR | Status: DC | PRN
  Administered 2017-08-20 (×2): 1 mg via INTRAVENOUS

## 2017-08-20 MED ORDER — LIDOCAINE HCL (PF) 2 % IJ SOLN
INTRAMUSCULAR | Status: AC
  Filled 2017-08-20: qty 10

## 2017-08-20 MED ORDER — SUGAMMADEX SODIUM 200 MG/2ML IV SOLN
INTRAVENOUS | Status: AC
  Filled 2017-08-20: qty 2

## 2017-08-20 MED ORDER — BUPIVACAINE LIPOSOME 1.3 % IJ SUSP
INTRAMUSCULAR | Status: DC | PRN
  Administered 2017-08-20: 50 mL

## 2017-08-20 MED ORDER — GABAPENTIN 300 MG PO CAPS
ORAL_CAPSULE | ORAL | Status: AC
  Filled 2017-08-20: qty 1

## 2017-08-20 MED ORDER — LABETALOL HCL 5 MG/ML IV SOLN
INTRAVENOUS | Status: AC
  Filled 2017-08-20: qty 4

## 2017-08-20 SURGICAL SUPPLY — 60 items
BLADE SURG 15 STRL LF DISP TIS (BLADE) ×1 IMPLANT
BLADE SURG 15 STRL SS (BLADE) ×2
BULB RESERV EVAC DRAIN JP 100C (MISCELLANEOUS) ×3 IMPLANT
CANISTER SUCT 1200ML W/VALVE (MISCELLANEOUS) ×3 IMPLANT
DRAIN CHANNEL JP 19F (MISCELLANEOUS) ×3 IMPLANT
DRAPE LAPAROTOMY 100X77 ABD (DRAPES) ×3 IMPLANT
DRAPE LEGGINS SURG 28X43 STRL (DRAPES) ×3 IMPLANT
DRAPE SHEET LG 3/4 BI-LAMINATE (DRAPES) ×3 IMPLANT
DRAPE TABLE BACK 80X90 (DRAPES) ×3 IMPLANT
DRAPE UNDER BUTTOCK W/FLU (DRAPES) ×3 IMPLANT
DRSG OPSITE POSTOP 4X10 (GAUZE/BANDAGES/DRESSINGS) ×3 IMPLANT
DRSG OPSITE POSTOP 4X14 (GAUZE/BANDAGES/DRESSINGS) ×3 IMPLANT
DRSG OPSITE POSTOP 4X6 (GAUZE/BANDAGES/DRESSINGS) ×6 IMPLANT
ELECT BLADE 6.5 EXT (BLADE) ×3 IMPLANT
ELECT CAUTERY BLADE 6.4 (BLADE) ×3 IMPLANT
ELECT REM PT RETURN 9FT ADLT (ELECTROSURGICAL) ×3
ELECTRODE REM PT RTRN 9FT ADLT (ELECTROSURGICAL) ×1 IMPLANT
GLOVE BIO SURGEON STRL SZ7.5 (GLOVE) ×9 IMPLANT
GLOVE INDICATOR 8.0 STRL GRN (GLOVE) ×9 IMPLANT
GOWN STRL REUS W/ TWL LRG LVL3 (GOWN DISPOSABLE) ×4 IMPLANT
GOWN STRL REUS W/ TWL XL LVL3 (GOWN DISPOSABLE) ×2 IMPLANT
GOWN STRL REUS W/TWL LRG LVL3 (GOWN DISPOSABLE) ×8
GOWN STRL REUS W/TWL XL LVL3 (GOWN DISPOSABLE) ×4
HOLDER FOLEY CATH W/STRAP (MISCELLANEOUS) ×3 IMPLANT
LABEL OR SOLS (LABEL) ×3 IMPLANT
LIGASURE IMPACT 36 18CM CVD LR (INSTRUMENTS) ×3 IMPLANT
LIGASURE LAP MARYLAND 5MM 37CM (ELECTROSURGICAL) IMPLANT
LOOP OSTOMY BRIDGE (OSTOMY) IMPLANT
NEEDLE HYPO 22GX1.5 SAFETY (NEEDLE) ×3 IMPLANT
NS IRRIG 1000ML POUR BTL (IV SOLUTION) ×3 IMPLANT
PACK BASIN MAJOR ARMC (MISCELLANEOUS) ×3 IMPLANT
PACK COLON CLEAN CLOSURE (MISCELLANEOUS) ×3 IMPLANT
RETAINER VISCERA MED (MISCELLANEOUS) ×3 IMPLANT
RETRACTOR WND ALEXIS-O 25 LRG (MISCELLANEOUS) ×1 IMPLANT
RTRCTR WOUND ALEXIS O 25CM LRG (MISCELLANEOUS) ×3
SOL PREP PVP 2OZ (MISCELLANEOUS) ×3
SOLUTION PREP PVP 2OZ (MISCELLANEOUS) ×1 IMPLANT
SPONGE LAP 18X18 5 PK (GAUZE/BANDAGES/DRESSINGS) ×6 IMPLANT
SPONGE VERSALON 4X4 4PLY (MISCELLANEOUS) ×3 IMPLANT
STAPLER CIRCULAR 29MM (STAPLE) ×3 IMPLANT
STAPLER CUT CVD 40MM BLUE (STAPLE) ×3 IMPLANT
STAPLER PROXIMATE 55 BLUE (STAPLE) ×3 IMPLANT
STAPLER SKIN PROX 35W (STAPLE) ×3 IMPLANT
SUT ETHILON 2 0 FS 18 (SUTURE) ×3 IMPLANT
SUT ETHILON 3-0 KS 30 BLK (SUTURE) IMPLANT
SUT NYLON 2-0 (SUTURE) ×3 IMPLANT
SUT PDS AB 1 TP1 54 (SUTURE) ×3 IMPLANT
SUT PDS AB 1 TP1 96 (SUTURE) ×6 IMPLANT
SUT PROLENE 2 0 FS (SUTURE) ×3 IMPLANT
SUT SILK 2 0 SH (SUTURE) ×3 IMPLANT
SUT SILK 2 0SH CR/8 30 (SUTURE) ×3 IMPLANT
SUT SILK 3-0 (SUTURE) ×3 IMPLANT
SUT VIC AB 1 CTX 27 (SUTURE) ×12 IMPLANT
SUT VIC AB 3-0 SH 27 (SUTURE) ×4
SUT VIC AB 3-0 SH 27X BRD (SUTURE) ×2 IMPLANT
SUT VICRYL PLUS ABS 0 54 (SUTURE) ×3 IMPLANT
SYR 30ML LL (SYRINGE) ×3 IMPLANT
SYR BULB IRRIG 60ML STRL (SYRINGE) ×3 IMPLANT
TOWEL OR 17X26 4PK STRL BLUE (TOWEL DISPOSABLE) ×3 IMPLANT
TRAY FOLEY W/METER SILVER 16FR (SET/KITS/TRAYS/PACK) ×3 IMPLANT

## 2017-08-20 NOTE — Anesthesia Procedure Notes (Signed)
Procedure Name: Intubation Date/Time: 08/20/2017 7:48 AM Performed by: Nile Riggs, CRNA Pre-anesthesia Checklist: Patient identified, Emergency Drugs available, Suction available, Patient being monitored and Timeout performed Patient Re-evaluated:Patient Re-evaluated prior to induction Oxygen Delivery Method: Circle system utilized Preoxygenation: Pre-oxygenation with 100% oxygen Induction Type: IV induction Ventilation: Mask ventilation without difficulty Laryngoscope Size: Miller and 2 Grade View: Grade I Tube type: Oral Tube size: 7.5 mm Number of attempts: 1 Airway Equipment and Method: Stylet Placement Confirmation: ETT inserted through vocal cords under direct vision,  positive ETCO2,  CO2 detector and breath sounds checked- equal and bilateral Secured at: 21 cm Tube secured with: Tape

## 2017-08-20 NOTE — Anesthesia Post-op Follow-up Note (Signed)
Anesthesia QCDR form completed.        

## 2017-08-20 NOTE — Transfer of Care (Signed)
Immediate Anesthesia Transfer of Care Note  Patient: Dana West  Procedure(s) Performed: COLOSTOMY REVERSAL (N/A )  Patient Location: PACU  Anesthesia Type:General  Level of Consciousness: drowsy and patient cooperative  Airway & Oxygen Therapy: Patient Spontanous Breathing and Patient connected to face mask oxygen  Post-op Assessment: Report given to RN and Post -op Vital signs reviewed and stable  Post vital signs: Reviewed and stable  Last Vitals:  Vitals:   08/20/17 0649  BP: 118/63  Pulse: 77  Resp: 16  Temp: (!) 36.4 C  SpO2: 100%    Last Pain:  Vitals:   08/20/17 0649  TempSrc: Oral         Complications: No apparent anesthesia complications

## 2017-08-20 NOTE — Anesthesia Postprocedure Evaluation (Signed)
Anesthesia Post Note  Patient: Dana West  Procedure(s) Performed: COLOSTOMY REVERSAL (N/A )  Patient location during evaluation: PACU Anesthesia Type: General Level of consciousness: awake and alert and oriented Pain management: pain level controlled Vital Signs Assessment: post-procedure vital signs reviewed and stable Respiratory status: spontaneous breathing Cardiovascular status: blood pressure returned to baseline Anesthetic complications: no     Last Vitals:  Vitals:   08/20/17 1452 08/20/17 1456  BP: (!) 141/106   Pulse: 92 89  Resp: 16 13  Temp:    SpO2: 98% 98%    Last Pain:  Vitals:   08/20/17 1456  TempSrc:   PainSc: 8                  Hila Bolding

## 2017-08-20 NOTE — Anesthesia Preprocedure Evaluation (Signed)
Anesthesia Evaluation  Patient identified by MRN, date of birth, ID band Patient awake    Reviewed: Allergy & Precautions, NPO status , Patient's Chart, lab work & pertinent test results  History of Anesthesia Complications Negative for: history of anesthetic complications  Airway Mallampati: III  TM Distance: >3 FB Neck ROM: Full    Dental  (+) Poor Dentition,    Pulmonary neg sleep apnea, neg COPD, Current Smoker, former smoker,    breath sounds clear to auscultation- rhonchi (-) wheezing      Cardiovascular Exercise Tolerance: Good (-) hypertension(-) CAD, (-) Past MI and (-) Cardiac Stents  Rhythm:Regular Rate:Normal - Systolic murmurs and - Diastolic murmurs    Neuro/Psych negative neurological ROS  negative psych ROS   GI/Hepatic Neg liver ROS, Bowel perforation    Endo/Other  negative endocrine ROSneg diabetes  Renal/GU negative Renal ROS  Female GU complaint     Musculoskeletal negative musculoskeletal ROS (+)   Abdominal (+) + obese,   Peds  Hematology negative hematology ROS (+) anemia ,   Anesthesia Other Findings Past Medical History: 01/12/2015: Abnormal uterine bleeding 04/18/2014: 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: Female infertility, 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/2015: Infertility, 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: Intramural leiomyoma of uterus 04/18/2014: Menometrorrhagia No date: Obesity (BMI 35.0-39.9 without comorbidity) 04/18/2014: Submucous leiomyoma of uterus 03/16/2017: Subserous leiomyoma of uterus   Reproductive/Obstetrics                             Anesthesia Physical  Anesthesia Plan  ASA: II and emergent  Anesthesia Plan: General   Post-op Pain Management:    Induction: Intravenous, Rapid sequence and Cricoid pressure planned  PONV Risk Score and Plan: 1 and Ondansetron and Dexamethasone  Airway Management Planned: Oral ETT  Additional Equipment:    Intra-op Plan:   Post-operative Plan: Extubation in OR  Informed Consent: I have reviewed the patients History and Physical, chart, labs and discussed the procedure including the risks, benefits and alternatives for the proposed anesthesia with the patient or authorized representative who has indicated his/her understanding and acceptance.   Dental advisory given  Plan Discussed with: CRNA and Anesthesiologist  Anesthesia Plan Comments:         Anesthesia Quick Evaluation

## 2017-08-20 NOTE — Op Note (Signed)
SURGICAL OPERATIVE REPORT  DATE OF PROCEDURE: 08/20/2017  ATTENDING Surgeon(s): Vickie Epley, MD  ASSISTANT(S): Nestor Lewandowsky, MD - Dr. 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 for perforated diverticulitis with pneumoperitoneum and feculent peritonitis following 1 month of inpatient and outpatient IV and PO antibiotics (icd-10's: Z93.3)  PROCEDURE(S):  1.) Reversal of Hartmann's colostomy (cpt: 16109) 2.) Takedown of splenic flexure and distal half of transverse colon (cpt: 60454) 3.) Partial colectomy with primary 2F EEA colorectal anastomosis  INTRAOPERATIVE FINDINGS: Healthy viable colostomy with very short mesentery as also described during patient's previous operation, requiring mobilization/takedown of splenic flexure, distal half of transverse colon, scoring of colonic mesentery, and freeing of mesentery from 1.5 inches distal transverse colon (remaining pink and viable) for distal transverse colon to reach rectal stump for creation of 29 F EEA colorectal anastomosis with minimal tension and no air bubbles appreciated on post-anastomosis leak test using rigid sigmoidoscope  INTRAVENOUS FLUIDS: 3700 mL crystalloid   ESTIMATED BLOOD LOSS: 150 mL   URINE OUTPUT: 150 mL   SPECIMENS: Source of Specimen:  Colonic splenic flexure and small segment of rectal stump  IMPLANTS: None  DRAINS: (61 F) Blake drain(s)  in the Pelvis  COMPLICATIONS: None apparent  CONDITION AT END OF PROCEDURE: Hemodynamically stable and extubated  DISPOSITION OF PATIENT: PACU  INDICATIONS FOR PROCEDURE:  42 y.o. Female previously underwent emergent creation of Hartmann's colostomy with rectal stump (04/04/2017) for perforated diverticulitis with pneumoperitoneum following 1 month of inpatient and outpatient IV and PO antibiotics. Patient has been doing well wtihdaily soft non-bloody BM's via colostomy without constipation or loose BM's and has requested reversal of her colostomy. She underwent colonoscopy and barium enema studies and describes having lost a total of 30 lbs since her initial surgery by dietary changes and exercise. All risks, benefits, and alternatives to colostomy reversal were discussed with the patient, all of patient's questions were answered to her expressed satisfaction, and informed consent was obtained and documented.  DETAILS OF PROCEDURE: Patient was brought to the operating suite and appropriately identified. General anesthesia was administered along with appropriate pre-operative antibiotics, and endotracheal intubation along with placement of NG tube were performed by anesthetist. In supine position, operative site was prepped and draped in the usual sterile fashion, and following a brief time out, 2-0 silk suture was used to close end colostomy to prevent spillage throughout the procedure. A transverse elliptical incision was then made surrounding the end-colostomy using a #15 blade scalpel, and incision was extended deep around end-colostomy to the level of fascia using combined blunt dissection and electrocautery and taking care to avoid injury to colonic serosa or mesentery.  The mucocutaneous border of the patient's colostomy was excised to obtain clean edges of colon. There was at this time concern regarding the length of colon required to reach the rectal stump for creation of a tension-free  colorectal anastomosis. Accordingly, attention was then directed to dissecting the rectal stump from surrounding both filmy adhesions attributable to prior colectomy as well as extensive dense fibrotic deep pelvic/peri-rectal scar  tissue, more likely attributable to patient's prior hysterectomy. Upon assessment of the colonic length required for tension-free colorectal anastomosis, the splenic flexure was mobilized, taking care to avoid injury to the colon, splenic capsule, or the pancreas. Unfortunately, even after fully mobilizing the patient's splenic flexure, detaching the patient's greater omentum from transverse colon, and scoring patient's mesentery, there remained inadequate colonic length to safely reach the rectal stump. However, it appeared that mid-/distal- transverse colon would likely reach further, and splenic flexure was accordingly resected using a GIA-55. This made possible EEA colorectal anastomosis with minimal tension, and takedown of hepatic flexure or other considerations seemed unlikely to offer better.  A short segment of rectal stump was excised using Contour curvilinear cutting stapler to obtain clear and straight stump into which to advance rectal dilators and EEA endostapler. Rigid sigmoidoscopy was performed with serial dilation to 33 mm. 29 mm EEA was selected, transverse colon was sharply opened without any spillage, and 3-0 Prolene pursestring suture was used to secure the anvil end within the transverse colon designated for EEA anastomosis. The EEA stapler was then advanced trans-rectally to the patient's Right of Contour staple line, anvil was attached to the EEA stapler with minimal to no tension, and EEA stapler was fired in standard fashion. Donuts were examined and found to be intact. Sigmoidoscopy with air bubble leak test was repeated x3 with no visualization of any air bubbles to suggest leak and widely patent anastomosis. Hemostasis was confirmed, intra-peritoneal cavity  was copiously irrigated, and greater omentum was placed over colorectal anastomosis.  The abdominal wall was re-approximated in layers with #1 looped PDS sutures from the top and bottom of the incision. Exparel (72-hour release liposomal formulation of bupivicane) was injected into fascia and subcutaneously, a few buried interrupted deep dermal 3-0 Vicryl sutures were also placed, and surgical skin staples were used to re-approximate skin. Similar was then performed for the colostomy site, which was additionally copiously irrigated with no buried interrupted deep dermal sutures and greater space left between the skin staples. Skin was then cleaned and dried, and a sterile dressing was applied. Patient was then safely able to be extubated, awakened, and transferred to PACU for post-operative monitoring and care.  I was present for all aspects of the above procedure, and there were no complications apparent.

## 2017-08-20 NOTE — Progress Notes (Signed)
Attempted IS instruct.  Pt asleep post procedure.  Will pass along to next shift.

## 2017-08-20 NOTE — Interval H&P Note (Signed)
History and Physical Interval Note:  08/20/2017 7:28 AM  Dana West  has presented today for surgery, with the diagnosis of COLOSTOMY IN PLACE  The various methods of treatment have been discussed with the patient and family. After consideration of risks, benefits and other options for treatment, the patient has consented to  Procedure(s): COLOSTOMY REVERSAL (N/A) as a surgical intervention .  The patient's history has been reviewed, patient examined, no change in status, stable for surgery.  I have reviewed the patient's chart and labs.  Questions were answered to the patient's satisfaction.     Vickie Epley

## 2017-08-21 ENCOUNTER — Encounter: Payer: Self-pay | Admitting: Surgery

## 2017-08-21 LAB — BASIC METABOLIC PANEL
ANION GAP: 9 (ref 5–15)
BUN: 13 mg/dL (ref 6–20)
CALCIUM: 8.4 mg/dL — AB (ref 8.9–10.3)
CO2: 22 mmol/L (ref 22–32)
Chloride: 104 mmol/L (ref 101–111)
Creatinine, Ser: 1.04 mg/dL — ABNORMAL HIGH (ref 0.44–1.00)
Glucose, Bld: 132 mg/dL — ABNORMAL HIGH (ref 65–99)
POTASSIUM: 4.6 mmol/L (ref 3.5–5.1)
SODIUM: 135 mmol/L (ref 135–145)

## 2017-08-21 LAB — CBC
HCT: 36.9 % (ref 35.0–47.0)
Hemoglobin: 11.9 g/dL — ABNORMAL LOW (ref 12.0–16.0)
MCH: 28.2 pg (ref 26.0–34.0)
MCHC: 32.3 g/dL (ref 32.0–36.0)
MCV: 87.1 fL (ref 80.0–100.0)
PLATELETS: 214 10*3/uL (ref 150–440)
RBC: 4.24 MIL/uL (ref 3.80–5.20)
RDW: 15.8 % — AB (ref 11.5–14.5)
WBC: 13.5 10*3/uL — AB (ref 3.6–11.0)

## 2017-08-21 LAB — SURGICAL PATHOLOGY

## 2017-08-21 MED ORDER — MORPHINE SULFATE (PF) 2 MG/ML IV SOLN
2.0000 mg | INTRAVENOUS | Status: DC | PRN
Start: 2017-08-21 — End: 2017-08-24
  Administered 2017-08-23: 2 mg via INTRAVENOUS
  Filled 2017-08-21 (×2): qty 1

## 2017-08-21 MED ORDER — OXYCODONE-ACETAMINOPHEN 5-325 MG PO TABS
1.0000 | ORAL_TABLET | ORAL | Status: DC | PRN
  Administered 2017-08-21 – 2017-08-24 (×10): 2 via ORAL
  Filled 2017-08-21 (×10): qty 2

## 2017-08-21 NOTE — Progress Notes (Signed)
[place] Hospital Day(s): 1.   Post op day(s): 1 Day Post-Op.   Interval History: Patient seen and examined, no acute events or new complaints overnight. Patient reports she is "sore" around her incisions and drain, but her pain has overall been "tolerable" and well-controlled. She otherwise reports occasional burping without N/V, abdominal distention, flatus, or BM and has not yet been up out of bed. She otherwise denies CP or SOB.  Review of Systems:  Constitutional: denies fever, chills  HEENT: denies cough or congestion  Respiratory: denies any shortness of breath  Cardiovascular: denies chest pain or palpitations  Gastrointestinal: abdominal pain, N/V, and bowel function as per interval history Genitourinary: denies burning with urination or urinary frequency Musculoskeletal: denies pain, decreased motor or sensation Integumentary: denies any other rashes or skin discolorations except post-surgical abdominal wounds Neurological: denies HA or vision/hearing changes   Vital signs in last 24 hours: [min-max] current  Temp:  [97.9 F (36.6 C)-99.2 F (37.3 C)] 98.1 F (36.7 C) (03/08 0841) Pulse Rate:  [75-99] 75 (03/08 0841) Resp:  [13-24] 18 (03/08 0841) BP: (119-150)/(54-106) 133/54 (03/08 0841) SpO2:  [94 %-100 %] 100 % (03/08 0841)     Height: 5\' 7"  (170.2 cm) Weight: 215 lb (97.5 kg) BMI (Calculated): 33.67   Intake/Output this shift:  No intake/output data recorded.   Intake/Output last 2 shifts:  @IOLAST2SHIFTS @   Physical Exam:  Constitutional: alert, cooperative and no distress  HENT: normocephalic without obvious abnormality  Eyes: PERRL, EOM's grossly intact and symmetric  Neuro: CN II - XII grossly intact and symmetric without deficit  Respiratory: breathing non-labored at rest  Cardiovascular: regular rate and sinus rhythm  Gastrointestinal: soft and non-distended with mild peri-incisional tenderness to palpation and incisions  well-approximated without any erythema or drainage except a small amount of dried blood on mid-laparotomy dressing Musculoskeletal: UE and LE FROM, no edema or wounds, motor and sensation grossly intact, NT   Labs:  CBC Latest Ref Rng & Units 08/21/2017 08/13/2017 04/09/2017  WBC 3.6 - 11.0 K/uL 13.5(H) 5.9 12.3(H)  Hemoglobin 12.0 - 16.0 g/dL 11.9(L) 13.1 9.0(L)  Hematocrit 35.0 - 47.0 % 36.9 40.2 27.5(L)  Platelets 150 - 440 K/uL 214 230 322   CMP Latest Ref Rng & Units 08/21/2017 08/13/2017 04/06/2017  Glucose 65 - 99 mg/dL 132(H) 90 80  BUN 6 - 20 mg/dL 13 16 13   Creatinine 0.44 - 1.00 mg/dL 1.04(H) 0.78 0.83  Sodium 135 - 145 mmol/L 135 138 134(L)  Potassium 3.5 - 5.1 mmol/L 4.6 4.2 3.3(L)  Chloride 101 - 111 mmol/L 104 106 102  CO2 22 - 32 mmol/L 22 24 24   Calcium 8.9 - 10.3 mg/dL 8.4(L) 9.3 7.9(L)  Total Protein 6.5 - 8.1 g/dL - 8.3(H) -  Total Bilirubin 0.3 - 1.2 mg/dL - 0.5 -  Alkaline Phos 38 - 126 U/L - 91 -  AST 15 - 41 U/L - 17 -  ALT 14 - 54 U/L - 13(L) -   Imaging studies: No new pertinent imaging studies   Assessment/Plan: (ICD-10's: Z93.3) 42 y.o. female doing well 1 Day Post-Op s/p challenging reversal of Hartmann's colostomy created emergently 5 months ago for perforated diverticulitis with pneumoperitoneumand feculent peritonitis following 1 month of inpatient and outpatient IV and PO antibiotics, complicated by comorbidities including obesity (BMI 34), chronic anemia, and former tobacco abuse.   - pain control prn   - Foley catheter removed  - continue IVF at same rate today, will allow sips  of clear liquids prn   - adjusted pain medications, since patient reports previously tolerated Percocet and morphine without allergic reaction  - complete peri-operative prophylactic antibiotics (<24 hours)  - out of bed, ambulation encouraged, DVT prophylaxis  All of the above findings and recommendations were discussed with the patient, patient's RN, and surgeon covering  this weekend (Dr. Dahlia Byes), and all of patient's questions were answered to her expressed satisfaction.  -- Marilynne Drivers Rosana Hoes, MD, [place]: [facility] - Partnering for exceptional care. Office: [phone]

## 2017-08-22 LAB — BASIC METABOLIC PANEL
ANION GAP: 5 (ref 5–15)
BUN: 9 mg/dL (ref 6–20)
CALCIUM: 8.3 mg/dL — AB (ref 8.9–10.3)
CO2: 26 mmol/L (ref 22–32)
Chloride: 105 mmol/L (ref 101–111)
Creatinine, Ser: 0.86 mg/dL (ref 0.44–1.00)
GFR calc Af Amer: >60 mL/min (ref >60)
GFR calc non Af Amer: >60 mL/min (ref >60)
GLUCOSE: 95 mg/dL (ref 65–99)
Potassium: 4.1 mmol/L (ref 3.5–5.1)
Sodium: 136 mmol/L (ref 135–145)

## 2017-08-22 LAB — CBC
HCT: 29.1 % — ABNORMAL LOW (ref 35.0–47.0)
Hemoglobin: 9.6 g/dL — ABNORMAL LOW (ref 12.0–16.0)
MCH: 28.9 pg (ref 26.0–34.0)
MCHC: 32.8 g/dL (ref 32.0–36.0)
MCV: 88 fL (ref 80.0–100.0)
PLATELETS: 169 10*3/uL (ref 150–440)
RBC: 3.31 MIL/uL — ABNORMAL LOW (ref 3.80–5.20)
RDW: 15.5 % — AB (ref 11.5–14.5)
WBC: 7.9 10*3/uL (ref 3.6–11.0)

## 2017-08-22 NOTE — Progress Notes (Signed)
POD # 3 Doing well Taking clears, + fltus, NO N/V AVSS Hb dropped likely HD AVSS Good UO  PE NAD Abd: soft, incision c/d/i, drain serous. No infection  A/P Doing well Advance to soft diet Decrease ivf mobilize Anticipate DC Monday

## 2017-08-23 NOTE — Progress Notes (Signed)
Time hr behind 

## 2017-08-23 NOTE — Progress Notes (Signed)
POD # 3 Doing well VSS Taking po + flatus AVSS  PE NAD Abd: incision c/d/i, no infection , staples in place, serous fluid from drain  A/P Doing well Regular diet heplock ivf DC in am

## 2017-08-24 MED ORDER — OXYCODONE-ACETAMINOPHEN 5-325 MG PO TABS: 1.0000 | ORAL_TABLET | ORAL | 0 refills | Status: DC | PRN

## 2017-08-24 NOTE — Discharge Instructions (Signed)
In addition to post-operative instructions for Colostomy Reversal/Takedown/Closure,  Diet: Gradually resume home heart healthy diet.  Activity: No heavy lifting >15 - 20 pounds (children, pets, laundry, garbage) or strenuous activity until follow-up, but light activity and walking are encouraged. Do not drive or drink alcohol if taking narcotic pain medications.  Wound care: You may shower/get incision wet with soapy water and pat dry (do not rub incisions), but no baths or submerging incision underwater until follow-up. Please otherwise keep your incision clean and dry, particularly at your umbilicus. Also perform drain care and monitor drain volumes emptied as instructed by your nurse prior to hospital discharge.  Medications: Resume all home medications. For mild to moderate pain: acetaminophen (Tylenol) or ibuprofen/naproxen (if no kidney disease). Combining Tylenol with alcohol can substantially increase your risk of causing liver disease. Narcotic pain medications, if prescribed, can be used for severe pain, though may cause nausea, constipation, and drowsiness. Do not combine Tylenol and Percocet (or similar) within a 6 hour period as Percocet (and similar) contain(s) Tylenol. If you do not need the narcotic pain medication, you do not need to fill the prescription.  To prevent constipation while taking narcotic pain medication or if you notice becoming constipated, may take over-the-counter stool softener such as Colace once daily. Please call our office if you are constipated and may be advised to take Miralax or magnesium citrate.  Call office [phone]) at any time if any questions, worsening pain, fevers/chills, bleeding, drainage from incision site, or other concerns.

## 2017-08-24 NOTE — Progress Notes (Signed)
Discharge instructions reviewed with patient and mother including medications, followup appointment, and drain care.  Understanding was verbalized and all questions were answered.  Patient discharged home via wheelchair in stable condition escorted by nursing staff.

## 2017-08-27 NOTE — Discharge Summary (Signed)
Physician Discharge Summary  Patient ID: Taite Baldassari MRN: [id number] DOB/AGE: [birth year] 42 y.o.  Admit date: 08/20/2017 Discharge date: 08/27/2017  Admission Diagnoses:  Discharge Diagnoses:  Active Problems:   Colostomy present [facility])   Discharged Condition: good  Hospital Course: 42 y.o. female presented to [facility] for elective reversal of Hartmann's colostomy for perforated diverticulitis Rosana Hoes, 04/04/2017). Informed consent was obtained and documented, and patient underwent challenging reversal of Hartmann's colostomy with primary EEA colorectal anastomosis Rosana Hoes, 08/20/2017).  Post-operatively, patient's pain improved/resolved and advancement of patient's diet and ambulation were well-tolerated. The remainder of patient's hospital course was essentially unremarkable, and discharge planning was initiated accordingly with patient safely able to be discharged home with appropriate discharge instructions, pain control, and outpatient surgical follow-up after all of her questions were answered to her expressed satisfaction.  Consults: None  Treatments: surgery: Challenging reversal of Hartmann's colostomy with primary EEA colorectal anastomosis Rosana Hoes, 08/20/2017)  Discharge Exam: Blood pressure 113/65, pulse 70, temperature 98.1 F (36.7 C), temperature source Oral, resp. rate 18, height 5\' 7"  (1.702 m), weight 215 lb (97.5 kg), last menstrual period 01/28/2015, SpO2 99 %. General appearance: alert, cooperative and no distress GI: abdomen soft and non-distended with minimal peri-incisional tenderness to palpation, incisions well-approximated without erythema or drainage  Disposition: 01-Home or Self Care   Allergies as of 08/24/2017      Reactions   Hydrocodone-acetaminophen Hives   Raspberry Nausea And Vomiting, Swelling   Valium [diazepam] Hives      Medication List    TAKE these medications   ibuprofen 200 MG tablet Commonly known as:  ADVIL,MOTRIN Take 400 mg by mouth  every 8 (eight) hours as needed (for pain.).   oxyCODONE-acetaminophen 5-325 MG tablet Commonly known as:  PERCOCET/ROXICET Take 1-2 tablets by mouth every 4 (four) hours as needed for severe pain.   prednisoLONE acetate 1 % ophthalmic suspension Commonly known as:  PRED FORTE Place 1 drop into both eyes at bedtime.      Follow-up Information    Vickie Epley, MD. Go on 09/02/2017.   Specialty:  General Surgery Why:  Wednesday at 11:00am for hospital follow-up Contact information: [address] [place] [address] [phone]           Signed: Vickie Epley 08/27/2017, 5:40 PM

## 2017-08-31 ENCOUNTER — Telehealth: Payer: Self-pay

## 2017-08-31 NOTE — Telephone Encounter (Signed)
Flagged on EMMI report for not being reachable at [phone] and indicated that she has a new phone number.  Attempted to reach patient by numbers on file in [facility].   The number above is her mother's residency, though received no answer when called.  Attempted work number listed, though was informed she no longer works there.  Removed number from [employer].   Tried the number listed for her cell, though received message that number no longer in service.   Will attempt to reach patient at mother's residence once more at later time to get new contact number.

## 2017-09-01 NOTE — Telephone Encounter (Signed)
Attempted for 2nd time on 09/01/17 at 9:56am.   Left voicemail with mother encouraging a call back with new phone number for patient so we could update our records.

## 2017-09-02 ENCOUNTER — Ambulatory Visit (INDEPENDENT_AMBULATORY_CARE_PROVIDER_SITE_OTHER): Payer: 59 | Admitting: Surgery

## 2017-09-02 ENCOUNTER — Encounter: Payer: Self-pay | Admitting: Surgery

## 2017-09-02 VITALS — BP 114/72 | HR 85 | Temp 98.0°F | Resp 14 | Ht 67.0 in | Wt 213.0 lb

## 2017-09-02 DIAGNOSIS — Z4889 Encounter for other specified surgical aftercare: Secondary | ICD-10-CM

## 2017-09-02 NOTE — Patient Instructions (Addendum)
You may return to work on 09/07/17. You will have lifting restrictions until April 18th. You will follow up next week on Thursday March 28th at 9:30 am.

## 2017-09-02 NOTE — Progress Notes (Signed)
Surgical Clinic Progress/Follow-up Note   HPI:  42 y.o. Female presents to clinic for post-op follow-up evaluation just under 2 weeks s/p challenging reversal of Hartmann's colostomy created for perforated diverticulitis with feculent peritonitis. Patient reports mild peri-incisional pain, for which she's been taking narcotic pain medication "sometimes" once in the morning and otherwise ibuprofen as needed. She has been passing flatus with normalizing initially loose non-bloody BM's, describes her drain output as "very little" and "clear". She otherwise reports tolerating regular diet without N/V, fever/chills, CP, or SOB and says she remains motivated to continue losing weight while maintaining good nutrition and exercising more once recovered from her recent surgery.  Review of Systems:  Constitutional: denies any other weight loss, fever, chills, or sweats  Eyes: denies any other vision changes, history of eye injury  ENT: denies sore throat, hearing problems  Respiratory: denies shortness of breath, wheezing  Cardiovascular: denies chest pain, palpitations  Gastrointestinal: abdominal pain, N/V, and bowel function as per HPI Musculoskeletal: denies any other joint pains or cramps  Skin: Denies any other rashes or skin discolorations  Neurological: denies any other headache, dizziness, weakness  Psychiatric: denies any other depression, anxiety  All other review of systems: otherwise negative   Vital Signs:  BP 114/72   Pulse 85   Temp 98 F (36.7 C)   Resp 14   Ht 5\' 7"  (1.702 m)   Wt 213 lb (96.6 kg)   LMP 01/28/2015 (Exact Date)   BMI 33.36 kg/m    Physical Exam:  Constitutional:  -- Normal body habitus (overweight, less obese)  -- Awake, alert, and oriented x3  Eyes:  -- Pupils equally round and reactive to light  -- No scleral icterus  Ear, nose, throat:  -- No jugular venous distension  -- No nasal drainage, bleeding Pulmonary:  -- No crackles -- Equal breath  sounds bilaterally -- Breathing non-labored at rest Cardiovascular:  -- S1, S2 present  -- No pericardial rubs  Gastrointestinal:  -- Soft, nontender, non-distended, no guarding/rebound -- Incisions well-approximated without any surrounding erythema or drainage -- RLQ drain with bulb to suction, well-secured with <5 mL and clear tan fluid in tubing -- No abdominal masses appreciated, pulsatile or otherwise  Musculoskeletal / Integumentary:  -- Wounds or skin discoloration: None appreciated except as described above (GI)  -- Extremities: B/L UE and LE FROM, hands and feet warm, no edema  Neurologic:  -- Motor function: intact and symmetric  -- Sensation: intact and symmetric    Assessment:  42 y.o. yo Female with a problem list including...  Patient Active Problem List   Diagnosis Date Noted  . Colostomy present ([place]) 08/20/2017  . S/P colostomy, follow-up exam   . Diverticulitis of large intestine with perforation and abscess 04/04/2017  . Bowel perforation ([place])   . Perforated diverticulum of large intestine   . Diverticulitis 03/20/2017  . 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 Abnormal uterine bleeding 01/12/2015  . 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 Intramural leiomyoma of uterus 01/11/2015  . 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 Menometrorrhagia 04/18/2014  . Submucous leiomyoma of uterus 04/18/2014    presents to clinic for post-op follow-up evaluation, doing overall quite well just under 2 weeks s/p challenging reversal of Hartmann's colostomy created for perforated sigmoid colonic diverticulitis with feculent peritonitis.  Plan:   - balance between weight loss and nutrition discussed   - RLQ JP and every other staple removed from  both incisions, steri-strips applied  - okay to shower and patient will plan to start new job next week, no heavy lifting >40 lbs  - return to clinic next week for  follow-up and to remove remaining staples  - instructed to call office if any questions or concerns  All of the above recommendations were discussed with the patient, and all of patient's questions were answered to her expressed satisfaction.  -- Marilynne Drivers Rosana Hoes, MD, [place]: [facility] - Partnering for exceptional care. Office: [phone]

## 2017-09-09 ENCOUNTER — Ambulatory Visit (INDEPENDENT_AMBULATORY_CARE_PROVIDER_SITE_OTHER): Payer: 59 | Admitting: Surgery

## 2017-09-09 ENCOUNTER — Encounter: Payer: Self-pay | Admitting: Surgery

## 2017-09-09 DIAGNOSIS — R1084 Generalized abdominal pain: Secondary | ICD-10-CM

## 2017-09-09 MED ORDER — CIPROFLOXACIN HCL 500 MG PO TABS: 500.0000 mg | ORAL_TABLET | Freq: Two times a day (BID) | ORAL | 0 refills | Status: DC

## 2017-09-09 MED ORDER — OXYCODONE HCL 5 MG PO TABS: 5.0000 mg | ORAL_TABLET | ORAL | 0 refills | Status: DC | PRN

## 2017-09-09 MED ORDER — METRONIDAZOLE 500 MG PO TABS: 500.0000 mg | ORAL_TABLET | Freq: Three times a day (TID) | ORAL | 0 refills | Status: DC

## 2017-09-09 NOTE — Patient Instructions (Addendum)
We have scheduled a CTscan to be done on 09/10/17 @ 11:00 am at [facility]  Please stop by there today and pick up the Oral contrast and instructions.   Please change the packing once daily and replace with new packing and apply a dressing over the area.   Please see your return  appointment listed below.

## 2017-09-09 NOTE — Progress Notes (Signed)
S/p colostomy takedown by Dr. Rosana Hoes 3/7 Having some drainage from the wound Taking PO, no fevers  PE NAD Abd: soft, midline incision healing well. Left colostomy site w purulent drainage. Staples removed and wound open, I drained about 50cc pus. Placed a dry wick No peritonitis  A/P  Wound infection I will start Po a/bs and obtain CT a/p to make sure there are not other collections, I will obtain CBC and CMP and see her back tomorrow

## 2017-09-10 ENCOUNTER — Ambulatory Visit
Admission: RE | Admit: 2017-09-10 | Discharge: 2017-09-10 | Disposition: A | Discharge status: Home / Self Care | Payer: 59 | Source: Ambulatory Visit | Attending: Surgery | Admitting: Surgery

## 2017-09-10 ENCOUNTER — Encounter: Payer: 59 | Admitting: General Surgery

## 2017-09-10 ENCOUNTER — Encounter: Payer: Self-pay | Admitting: Surgery

## 2017-09-10 ENCOUNTER — Other Ambulatory Visit
Admission: RE | Admit: 2017-09-10 | Discharge: 2017-09-10 | Disposition: A | Discharge status: Home / Self Care | Payer: 59 | Source: Ambulatory Visit | Attending: Surgery | Admitting: Surgery

## 2017-09-10 ENCOUNTER — Ambulatory Visit (INDEPENDENT_AMBULATORY_CARE_PROVIDER_SITE_OTHER): Payer: 59 | Admitting: Surgery

## 2017-09-10 VITALS — BP 129/76 | HR 93 | Temp 98.2°F | Ht 67.0 in | Wt 204.0 lb

## 2017-09-10 DIAGNOSIS — Z09 Encounter for follow-up examination after completed treatment for conditions other than malignant neoplasm: Secondary | ICD-10-CM

## 2017-09-10 DIAGNOSIS — R1084 Generalized abdominal pain: Secondary | ICD-10-CM | POA: Insufficient documentation

## 2017-09-10 DIAGNOSIS — L02211 Cutaneous abscess of abdominal wall: Secondary | ICD-10-CM | POA: Diagnosis not present

## 2017-09-10 LAB — COMPREHENSIVE METABOLIC PANEL
ALBUMIN: 2.9 g/dL — AB (ref 3.5–5.0)
ALK PHOS: 69 U/L (ref 38–126)
ALT: 28 U/L (ref 14–54)
ANION GAP: 14 (ref 5–15)
AST: 16 U/L (ref 15–41)
BILIRUBIN TOTAL: 0.6 mg/dL (ref 0.3–1.2)
BUN: 11 mg/dL (ref 6–20)
CALCIUM: 9.1 mg/dL (ref 8.9–10.3)
CO2: 24 mmol/L (ref 22–32)
Chloride: 94 mmol/L — ABNORMAL LOW (ref 101–111)
Creatinine, Ser: 0.64 mg/dL (ref 0.44–1.00)
GFR calc non Af Amer: >60 mL/min (ref >60)
Glucose, Bld: 96 mg/dL (ref 65–99)
POTASSIUM: 3.4 mmol/L — AB (ref 3.5–5.1)
SODIUM: 132 mmol/L — AB (ref 135–145)
TOTAL PROTEIN: 8.6 g/dL — AB (ref 6.5–8.1)

## 2017-09-10 LAB — CBC WITH DIFFERENTIAL/PLATELET
BASOS ABS: 0.1 10*3/uL (ref 0–0.1)
BASOS PCT: 0 %
Eosinophils Absolute: 0.2 10*3/uL (ref 0–0.7)
Eosinophils Relative: 2 %
HEMATOCRIT: 32 % — AB (ref 35.0–47.0)
HEMOGLOBIN: 10.6 g/dL — AB (ref 12.0–16.0)
LYMPHS PCT: 7 %
Lymphs Abs: 0.9 10*3/uL — ABNORMAL LOW (ref 1.0–3.6)
MCH: 28.5 pg (ref 26.0–34.0)
MCHC: 33.3 g/dL (ref 32.0–36.0)
MCV: 85.6 fL (ref 80.0–100.0)
Monocytes Absolute: 1.2 10*3/uL — ABNORMAL HIGH (ref 0.2–0.9)
Monocytes Relative: 9 %
NEUTROS ABS: 10.6 10*3/uL — AB (ref 1.4–6.5)
NEUTROS PCT: 82 %
Platelets: 451 10*3/uL — ABNORMAL HIGH (ref 150–440)
RBC: 3.73 MIL/uL — ABNORMAL LOW (ref 3.80–5.20)
RDW: 14.5 % (ref 11.5–14.5)
WBC: 13 10*3/uL — ABNORMAL HIGH (ref 3.6–11.0)

## 2017-09-10 MED ORDER — IOPAMIDOL (ISOVUE-300) INJECTION 61%
100.0000 mL | Freq: Once | INTRAVENOUS | Status: AC | PRN
  Administered 2017-09-10: 100 mL via INTRAVENOUS

## 2017-09-10 NOTE — Progress Notes (Signed)
S/p colostomy takedown Doing better after I opened the wound and drained the abscess. Ct scan reviewed, there is some collection abd wall. No intra-abdominal component or anastomotic leak She did not get the A/bs    PE NAD Abd soft, I expressed more fluid about 15 cc and placed a 1/2 packing. No peritonitis  A/p Wound abscess A/bs  RTC next week for a wound check No need for surgical intervention

## 2017-09-10 NOTE — Patient Instructions (Signed)
Please pick up your medicine at the pharmacy abd start taking it today.  Please remove old packing from wound and replace with new packing daily and apply a dressing over the area.  Please see your follow up appointment listed below.

## 2017-09-11 ENCOUNTER — Telehealth: Payer: Self-pay

## 2017-09-11 MED ORDER — CIPROFLOXACIN HCL 500 MG PO TABS: 500.0000 mg | ORAL_TABLET | Freq: Two times a day (BID) | ORAL | 0 refills | Status: DC

## 2017-09-11 MED ORDER — METRONIDAZOLE 500 MG PO TABS: 500.0000 mg | ORAL_TABLET | Freq: Three times a day (TID) | ORAL | 0 refills | Status: DC

## 2017-09-11 NOTE — Telephone Encounter (Signed)
Medication re-sent to pharmacy at this time. 

## 2017-09-17 ENCOUNTER — Encounter: Payer: Self-pay | Admitting: Surgery

## 2017-10-07 ENCOUNTER — Encounter: Payer: Self-pay | Admitting: Surgery

## 2017-11-25 ENCOUNTER — Encounter: Payer: Self-pay | Admitting: Surgery

## 2017-12-01 ENCOUNTER — Telehealth: Payer: Self-pay | Admitting: Surgery

## 2017-12-01 NOTE — Telephone Encounter (Signed)
Unable to leave a message for the patient to call the office to r/s her no showed appointment with Dr. Rosana Hoes on 11/25/17. I have also mailed a letter for the patient to call the office to r/s.

## 2017-12-24 ENCOUNTER — Ambulatory Visit: Payer: Self-pay | Admitting: Surgery

## 2018-09-05 ENCOUNTER — Emergency Department
Admission: EM | Admit: 2018-09-05 | Discharge: 2018-09-06 | Disposition: A | Discharge status: Home / Self Care | Payer: Self-pay | Attending: Emergency Medicine | Admitting: Emergency Medicine

## 2018-09-05 ENCOUNTER — Other Ambulatory Visit: Payer: Self-pay

## 2018-09-05 ENCOUNTER — Emergency Department: Payer: Self-pay

## 2018-09-05 ENCOUNTER — Encounter: Payer: Self-pay | Admitting: Emergency Medicine

## 2018-09-05 DIAGNOSIS — Y999 Unspecified external cause status: Secondary | ICD-10-CM | POA: Insufficient documentation

## 2018-09-05 DIAGNOSIS — W010XXA Fall on same level from slipping, tripping and stumbling without subsequent striking against object, initial encounter: Secondary | ICD-10-CM | POA: Insufficient documentation

## 2018-09-05 DIAGNOSIS — Y9289 Other specified places as the place of occurrence of the external cause: Secondary | ICD-10-CM | POA: Insufficient documentation

## 2018-09-05 DIAGNOSIS — Z87891 Personal history of nicotine dependence: Secondary | ICD-10-CM | POA: Insufficient documentation

## 2018-09-05 DIAGNOSIS — Z79899 Other long term (current) drug therapy: Secondary | ICD-10-CM | POA: Insufficient documentation

## 2018-09-05 DIAGNOSIS — S82832A Other fracture of upper and lower end of left fibula, initial encounter for closed fracture: Secondary | ICD-10-CM | POA: Insufficient documentation

## 2018-09-05 DIAGNOSIS — Y9301 Activity, walking, marching and hiking: Secondary | ICD-10-CM | POA: Insufficient documentation

## 2018-09-05 MED ORDER — MELOXICAM 15 MG PO TABS: 15.0000 mg | ORAL_TABLET | Freq: Every day | ORAL | 1 refills | Status: AC

## 2018-09-05 MED ORDER — OXYCODONE-ACETAMINOPHEN 5-325 MG PO TABS: 1.0000 | ORAL_TABLET | Freq: Four times a day (QID) | ORAL | 0 refills | Status: AC | PRN

## 2018-09-05 MED ORDER — KETOROLAC TROMETHAMINE 30 MG/ML IJ SOLN
30.0000 mg | Freq: Once | INTRAMUSCULAR | Status: AC
  Administered 2018-09-05: 30 mg via INTRAMUSCULAR
  Filled 2018-09-05: qty 1

## 2018-09-05 NOTE — ED Triage Notes (Signed)
Pt arrives POV to triage with c/o left ankle injury when her dog saw a squirrel earlier.

## 2018-09-05 NOTE — ED Provider Notes (Signed)
[facility] Emergency Department Provider Note  ____________________________________________  Time seen: Approximately 11:29 PM  I have reviewed the triage vital signs and the nursing notes.   HISTORY  Chief Complaint Ankle Pain    HPI Dana West is a 43 y.o. female presents to the emergency department with acute left ankle pain after patient reports that she tripped while walking her dog.  Patient sustained an inversion type injury.  No numbness or tingling of the left lower extremity.  No abrasions or lacerations.  Patient has had difficulty bearing weight since incident occurred.  No alleviating medications were attempted prior to presenting to the emergency department.        Past Medical History:  Diagnosis Date  . Abnormal uterine bleeding 01/12/2015  . Anemia   . Bowel perforation ([place])   . Diverticulitis of large intestine with perforation and abscess 04/04/2017  . 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 Intramural leiomyoma of uterus 01/11/2015  . Menometrorrhagia 04/18/2014  . Obesity (BMI 35.0-39.9 without comorbidity)   . Perforated diverticulum of large intestine   . Submucous leiomyoma of uterus 04/18/2014  . Subserous leiomyoma of uterus 03/16/2017    Patient Active Problem List   Diagnosis Date Noted  . 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 Abnormal uterine bleeding 01/12/2015  . 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 Intramural leiomyoma of uterus 01/11/2015  . 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 Menometrorrhagia 04/18/2014  . Submucous leiomyoma of uterus 04/18/2014    Past Surgical History:  Procedure Laterality Date  . ABDOMINAL HYSTERECTOMY  2017  . COLECTOMY WITH COLOSTOMY CREATION/HARTMANN PROCEDURE N/A 04/03/2017   Procedure: SIGMOID COLECTOMY WITH COLOSTOMY  CREATION/HARTMANN PROCEDURE;  Surgeon: Vickie Epley, MD;  Location: [facility] ORS;  Service: General;  Laterality: N/A;  . COLONOSCOPY WITH PROPOFOL N/A 07/21/2017   Procedure: COLONOSCOPY WITH PROPOFOL;  Surgeon: Lin Landsman, MD;  Location: [facility] ENDOSCOPY;  Service: Gastroenterology;  Laterality: N/A;  . COLOSTOMY REVERSAL N/A 08/20/2017   Procedure: COLOSTOMY REVERSAL;  Surgeon: Vickie Epley, MD;  Location: [facility] ORS;  Service: General;  Laterality: N/A;  . UTERINE FIBROID SURGERY      Prior to Admission medications   Medication Sig Start Date End Date Taking? Authorizing Provider  ciprofloxacin (CIPRO) 500 MG tablet Take 1 tablet (500 mg total) by mouth 2 (two) times daily. 09/11/17   Pabon, Diego F, MD  ibuprofen (ADVIL,MOTRIN) 200 MG tablet Take 400 mg by mouth every 8 (eight) hours as needed (for pain.).    [provider]  meloxicam (MOBIC) 15 MG tablet Take 1 tablet (15 mg total) by mouth daily for 7 days. 09/05/18 09/12/18  Lannie Fields, PA-C  metroNIDAZOLE (FLAGYL) 500 MG tablet Take 1 tablet (500 mg total) by mouth 3 (three) times daily. 09/11/17   Pabon, Diego F, MD  oxyCODONE (ROXICODONE) 5 MG immediate release tablet Take 1 tablet (5 mg total) by mouth every 4 (four) hours as needed for severe pain. 09/09/17   Pabon, Marjory Lies, MD  oxyCODONE-acetaminophen (PERCOCET/ROXICET) 5-325 MG tablet Take 1 tablet by mouth every 6 (six) hours as needed for up to 3 days for severe pain. 09/05/18 09/08/18  Lannie Fields, PA-C  prednisoLONE acetate (PRED FORTE) 1 % ophthalmic suspension Place 1 drop into both eyes at bedtime.    [provider]  Allergies Hydrocodone-acetaminophen; Raspberry; and Valium [diazepam]  Family History  Problem Relation Age of Onset  . Alcohol abuse Neg Hx   . Arthritis Neg Hx   . Asthma Neg Hx   . Birth defects Neg Hx   . Cancer Neg Hx   . COPD Neg Hx   . Depression Neg Hx   . Diabetes Neg Hx   . Drug abuse Neg Hx   . Early death  Neg Hx   . Hearing loss Neg Hx   . Heart disease Neg Hx   . Hyperlipidemia Neg Hx   . Hypertension Neg Hx   . Kidney disease Neg Hx   . Learning disabilities Neg Hx   . Mental illness Neg Hx   . Mental retardation Neg Hx   . Miscarriages / Stillbirths Neg Hx   . Stroke Neg Hx   . Vision loss Neg Hx   . Varicose Veins Neg Hx     Social History Social History   Tobacco Use  . Smoking status: Former Smoker    Last attempt to quit: 07/13/2017    Years since quitting: 1.1  . Smokeless tobacco: Never Used  Substance Use Topics  . Alcohol use: No    Comment: seldom  . Drug use: No     Review of Systems  Constitutional: No fever/chills Eyes: No visual changes. No discharge ENT: No upper respiratory complaints. Cardiovascular: no chest pain. Respiratory: no cough. No SOB. Gastrointestinal: No abdominal pain.  No nausea, no vomiting.  No diarrhea.  No constipation. Musculoskeletal: Patient has left ankle pain.  Skin: Negative for rash, abrasions, lacerations, ecchymosis. Neurological: Negative for headaches, focal weakness or numbness.   ____________________________________________   PHYSICAL EXAM:  VITAL SIGNS: ED Triage Vitals  Enc Vitals Group     BP 09/05/18 2252 (!) 141/78     Pulse Rate 09/05/18 2252 87     Resp 09/05/18 2252 18     Temp 09/05/18 2252 98.4 F (36.9 C)     Temp Source 09/05/18 2252 Oral     SpO2 09/05/18 2252 100 %     Weight 09/05/18 2250 176 lb (79.8 kg)     Height 09/05/18 2250 5\' 7"  (1.702 m)     Head Circumference --      Peak Flow --      Pain Score 09/05/18 2250 10     Pain Loc --      Pain Edu? --      Excl. in [place]? --      Constitutional: Alert and oriented. Well appearing and in no acute distress. Eyes: Conjunctivae are normal. PERRL. EOMI. Head: Atraumatic. Cardiovascular: Normal rate, regular rhythm. Normal S1 and S2.  Good peripheral circulation. Respiratory: Normal respiratory effort without tachypnea or retractions.  Lungs CTAB. Good air entry to the bases with no decreased or absent breath sounds. Musculoskeletal: Patient performs limited range of motion at the left ankle, likely secondary to pain.  Patient has tenderness and pain to palpation over the lateral malleolus.  No significant pain over the deltoid ligament.  Patient is able to move all 5 left toes.  Palpable dorsalis pedis pulse bilaterally and symmetrically. Neurologic:  Normal speech and language. No gross focal neurologic deficits are appreciated.  Skin:  Skin is warm, dry and intact. No rash noted. Psychiatric: Mood and affect are normal. Speech and behavior are normal. Patient exhibits appropriate insight and judgement.   ____________________________________________   LABS (all labs ordered are listed, but only abnormal  results are displayed)  Labs Reviewed - No data to display ____________________________________________  EKG   ____________________________________________  RADIOLOGY I personally viewed and evaluated these images as part of my medical decision making, as well as reviewing the written report by the radiologist.    Dg Ankle Complete Left  Result Date: 09/05/2018 CLINICAL DATA:  Initial evaluation for acute left ankle pain, fall. EXAM: LEFT ANKLE COMPLETE - 3+ VIEW COMPARISON:  None. FINDINGS: Focal osseous fragment at the distal aspect of the left lateral malleolus, suspicious for an acute avulsion type fracture injury. No other acute fracture dislocation. Ankle mortise approximated. Talar dome intact. Soft tissue swelling overlies the lateral malleolus. Degenerative spurring noted at the dorsal midfoot. IMPRESSION: 1. Minimally displaced osseous fragment at the distal aspect of the lateral malleolus, suspicious for an acute avulsion type fracture fragment. 2. Focal soft tissue swelling overlying the lateral malleolus. Electronically Signed   By: Jeannine Boga M.D.   On: 09/05/2018 23:29     ____________________________________________    PROCEDURES  Procedure(s) performed:    Procedures    Medications  ketorolac (TORADOL) 30 MG/ML injection 30 mg (has no administration in time range)     ____________________________________________   INITIAL IMPRESSION / ASSESSMENT AND PLAN / ED COURSE  Pertinent labs & imaging results that were available during my care of the patient were reviewed by me and considered in my medical decision making (see chart for details).  Review of the [place] CSRS was performed in accordance of the [place] prior to dispensing any controlled drugs.           Assessment and plan Fibular fracture Patient presents to the emergency department with acute left ankle pain after an inversion type ankle injury sustained while walking her dog.  X-ray examination shows an avulsion type fracture of the distal fibula.  Patient was splinted in the emergency department and crutches were provided.  Roxicet was prescribed pain discharge.  Patient was advised to follow-up with orthopedics as needed.  All patient questions were answered.    ____________________________________________  FINAL CLINICAL IMPRESSION(S) / ED DIAGNOSES  Final diagnoses:  Closed fracture of distal end of left fibula, unspecified fracture morphology, initial encounter      NEW MEDICATIONS STARTED DURING THIS VISIT:  ED Discharge Orders         Ordered    oxyCODONE-acetaminophen (PERCOCET/ROXICET) 5-325 MG tablet  Every 6 hours PRN     09/05/18 2328    meloxicam (MOBIC) 15 MG tablet  Daily     09/05/18 2328              This chart was dictated using voice recognition software/Dragon. Despite best efforts to proofread, errors can occur which can change the meaning. Any change was purely unintentional.    Karren Cobble 09/05/18 2339    Nance Pear, MD 09/07/18 [phone]

## 2018-09-06 NOTE — ED Notes (Signed)
Pt verbalizes d/c understanding and follow up. Pt given RXs. PT in NAD. PT unable to sign e-signature pad due to malfnx

## 2019-11-24 ENCOUNTER — Emergency Department
Admission: EM | Admit: 2019-11-24 | Discharge: 2019-11-24 | Disposition: A | Discharge status: Home / Self Care | Payer: 59 | Attending: Emergency Medicine | Admitting: Emergency Medicine

## 2019-11-24 ENCOUNTER — Other Ambulatory Visit: Payer: Self-pay

## 2019-11-24 ENCOUNTER — Encounter: Payer: Self-pay | Admitting: Emergency Medicine

## 2019-11-24 DIAGNOSIS — Z87891 Personal history of nicotine dependence: Secondary | ICD-10-CM | POA: Insufficient documentation

## 2019-11-24 DIAGNOSIS — R1032 Left lower quadrant pain: Secondary | ICD-10-CM | POA: Insufficient documentation

## 2019-11-24 DIAGNOSIS — R112 Nausea with vomiting, unspecified: Secondary | ICD-10-CM | POA: Insufficient documentation

## 2019-11-24 DIAGNOSIS — Z79899 Other long term (current) drug therapy: Secondary | ICD-10-CM | POA: Diagnosis not present

## 2019-11-24 LAB — CBC
HCT: 38.2 % (ref 36.0–46.0)
Hemoglobin: 12.4 g/dL (ref 12.0–15.0)
MCH: 30.7 pg (ref 26.0–34.0)
MCHC: 32.5 g/dL (ref 30.0–36.0)
MCV: 94.6 fL (ref 80.0–100.0)
Platelets: 213 10*3/uL (ref 150–400)
RBC: 4.04 MIL/uL (ref 3.87–5.11)
RDW: 13.1 % (ref 11.5–15.5)
WBC: 5.3 10*3/uL (ref 4.0–10.5)
nRBC: 0 % (ref 0.0–0.2)

## 2019-11-24 LAB — URINALYSIS, COMPLETE (UACMP) WITH MICROSCOPIC
Bacteria, UA: NONE SEEN
Bilirubin Urine: NEGATIVE
Glucose, UA: NEGATIVE mg/dL
Hgb urine dipstick: NEGATIVE
Ketones, ur: NEGATIVE mg/dL
Leukocytes,Ua: NEGATIVE
Nitrite: NEGATIVE
Protein, ur: NEGATIVE mg/dL
Specific Gravity, Urine: 1.025 (ref 1.005–1.030)
pH: 6 (ref 5.0–8.0)

## 2019-11-24 LAB — COMPREHENSIVE METABOLIC PANEL
ALT: 16 U/L (ref 0–44)
AST: 17 U/L (ref 15–41)
Albumin: 3.8 g/dL (ref 3.5–5.0)
Alkaline Phosphatase: 62 U/L (ref 38–126)
Anion gap: 4 — ABNORMAL LOW (ref 5–15)
BUN: 19 mg/dL (ref 6–20)
CO2: 25 mmol/L (ref 22–32)
Calcium: 9.2 mg/dL (ref 8.9–10.3)
Chloride: 110 mmol/L (ref 98–111)
Creatinine, Ser: 0.72 mg/dL (ref 0.44–1.00)
GFR calc Af Amer: >60 mL/min (ref >60)
GFR calc non Af Amer: >60 mL/min (ref >60)
Glucose, Bld: 98 mg/dL (ref 70–99)
Potassium: 4 mmol/L (ref 3.5–5.1)
Sodium: 139 mmol/L (ref 135–145)
Total Bilirubin: 0.6 mg/dL (ref 0.3–1.2)
Total Protein: 7.7 g/dL (ref 6.5–8.1)

## 2019-11-24 LAB — LIPASE, BLOOD: Lipase: 32 U/L (ref 11–51)

## 2019-11-24 MED ORDER — SODIUM CHLORIDE 0.9% FLUSH: 3.0000 mL | Freq: Once | INTRAVENOUS | Status: DC

## 2019-11-24 NOTE — ED Triage Notes (Signed)
Pt presents to ED via POV with c/o LLQ abdominal pain and nausea that started last night. Pt describes pain as pinching. Pt states hx of diverticulitis with bowel resection, colostomy placement and reversal. Pt states unable to sleep last night due to pain. VSS in triage.

## 2019-11-24 NOTE — ED Notes (Signed)
Pt c/o abdominal pain and N/V. Hx of diverticulitis

## 2019-11-24 NOTE — ED Provider Notes (Signed)
[facility] Emergency Department Provider Note   ____________________________________________    I have reviewed the triage vital signs and the nursing notes.   HISTORY  Chief Complaint Abdominal Pain and Nausea     HPI Dana West is a 44 y.o. female with a history of partial colectomy in 2018 with subsequent colostomy reversal s/p perforated diverticulitis with abscess. Has been doing well since reversal. Frequently gets pinching sensation in llq underlying scar. This happened last night with some nausea. She attributes this to eating [place] food as now she is feeling well and is asymptomatic. She reports drinking a lot of water helped. No f/c. No dysuria.   Past Medical History:  Diagnosis Date  . Abnormal uterine bleeding 01/12/2015  . Anemia   . Bowel perforation ([place])   . Diverticulitis of large intestine with perforation and abscess 04/04/2017  . 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 Intramural leiomyoma of uterus 01/11/2015  . Menometrorrhagia 04/18/2014  . Obesity (BMI 35.0-39.9 without comorbidity)   . Perforated diverticulum of large intestine   . Submucous leiomyoma of uterus 04/18/2014  . Subserous leiomyoma of uterus 03/16/2017    Patient Active Problem List   Diagnosis Date Noted  . 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 Abnormal uterine bleeding 01/12/2015  . 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 Intramural leiomyoma of uterus 01/11/2015  . 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 Menometrorrhagia 04/18/2014  . Submucous leiomyoma of uterus 04/18/2014    Past Surgical History:  Procedure Laterality Date  . ABDOMINAL HYSTERECTOMY  2017  . COLECTOMY WITH COLOSTOMY CREATION/HARTMANN PROCEDURE N/A 04/03/2017   Procedure: SIGMOID COLECTOMY WITH COLOSTOMY  CREATION/HARTMANN PROCEDURE;  Surgeon: Vickie Epley, MD;  Location: [facility] ORS;  Service: General;  Laterality: N/A;  . COLONOSCOPY WITH PROPOFOL N/A 07/21/2017   Procedure: COLONOSCOPY WITH PROPOFOL;  Surgeon: Lin Landsman, MD;  Location: [facility] ENDOSCOPY;  Service: Gastroenterology;  Laterality: N/A;  . COLOSTOMY REVERSAL N/A 08/20/2017   Procedure: COLOSTOMY REVERSAL;  Surgeon: Vickie Epley, MD;  Location: [facility] ORS;  Service: General;  Laterality: N/A;  . UTERINE FIBROID SURGERY      Prior to Admission medications   Medication Sig Start Date End Date Taking? Authorizing Provider  ibuprofen (ADVIL,MOTRIN) 200 MG tablet Take 400 mg by mouth every 8 (eight) hours as needed (for pain.).    [provider]  prednisoLONE acetate (PRED FORTE) 1 % ophthalmic suspension Place 1 drop into both eyes at bedtime.    [provider]     Allergies Hydrocodone-acetaminophen, Raspberry, and Valium [diazepam]  Family History  Problem Relation Age of Onset  . Alcohol abuse Neg Hx   . Arthritis Neg Hx   . Asthma Neg Hx   . Birth defects Neg Hx   . Cancer Neg Hx   . COPD Neg Hx   . Depression Neg Hx   . Diabetes Neg Hx   . Drug abuse Neg Hx   . Early death Neg Hx   . Hearing loss Neg Hx   . Heart disease Neg Hx   . Hyperlipidemia Neg Hx   . Hypertension Neg Hx   . Kidney disease Neg Hx   . Learning disabilities Neg Hx   . Mental illness Neg Hx   . Mental retardation Neg Hx   . Miscarriages / Stillbirths Neg  Hx   . Stroke Neg Hx   . Vision loss Neg Hx   . Varicose Veins Neg Hx     Social History Social History   Tobacco Use  . Smoking status: Former Smoker    Quit date: 07/13/2017    Years since quitting: 2.3  . Smokeless tobacco: Never Used  Vaping Use  . Vaping Use: Never used  Substance Use Topics  . Alcohol use: No    Comment: seldom  . Drug use: No    Review of Systems  Constitutional: No fever/chills Eyes: No visual changes.  ENT: No sore  throat. Cardiovascular: Denies chest pain. Respiratory: Denies shortness of breath. Gastrointestinal: as above Genitourinary: no dysuria Musculoskeletal: Negative for back pain. Skin: Negative for rash. Neurological: Negative for headaches or weakness   ____________________________________________   PHYSICAL EXAM:  VITAL SIGNS: ED Triage Vitals [11/24/19 1114]  Enc Vitals Group     BP (!) 118/56     Pulse Rate 74     Resp 20     Temp 98.4 F (36.9 C)     Temp Source Oral     SpO2 96 %     Weight 98.9 kg (218 lb)     Height 1.702 m (5\' 7" )     Head Circumference      Peak Flow      Pain Score 8     Pain Loc      Pain Edu?      Excl. in [place]?     Constitutional: Alert and oriented. No acute distress. Pleasant and interactive Eyes: Conjunctivae are normal.  Head: Atraumatic.  Cardiovascular: Normal rate, regular rhythm. Good peripheral circulation. Respiratory: Normal respiratory effort.  No retractions.  Gastrointestinal: Soft and nontender. No distention.  No CVA tenderness. Reassuring exam  Musculoskeletal:   Warm and well perfused Neurologic:  Normal speech and language. No gross focal neurologic deficits are appreciated.  Skin:  Skin is warm, dry and intact. No rash noted. Psychiatric: Mood and affect are normal. Speech and behavior are normal.  ____________________________________________   LABS (all labs ordered are listed, but only abnormal results are displayed)  Labs Reviewed  COMPREHENSIVE METABOLIC PANEL - Abnormal; Notable for the following components:      Result Value   Anion gap 4 (*)    All other components within normal limits  URINALYSIS, COMPLETE (UACMP) WITH MICROSCOPIC - Abnormal; Notable for the following components:   Color, Urine YELLOW (*)    APPearance HAZY (*)    All other components within normal limits  LIPASE, BLOOD  CBC    ____________________________________________  EKG   ____________________________________________  RADIOLOGY   ____________________________________________   PROCEDURES  Procedure(s) performed: No  Procedures   Critical Care performed: No ____________________________________________   INITIAL IMPRESSION / ASSESSMENT AND PLAN / ED COURSE  Pertinent labs & imaging results that were available during my care of the patient were reviewed by me and considered in my medical decision making (see chart for details).  Patient with significant hx of abdominal surgery as noted above. Differential includes non-specific abdominal cramping, sbo, diverticulitis, anastomotic discomfort, colitis.   Lab work is normal. WBC is normal.  However, she is asymptomatic at this time and has been x 2 hours. I did offer CT abd/pelv given her hx but she declined. She notes she feels well and just wanted to be checked. She is reassured regarding normal labs. She notes she will return immediately if any worsening pain.    ____________________________________________  FINAL CLINICAL IMPRESSION(S) / ED DIAGNOSES  Final diagnoses:  Left lower quadrant abdominal pain        Note:  This document was prepared using Dragon voice recognition software and may include unintentional dictation errors.   Lavonia Drafts, MD 11/24/19 1252

## 2019-12-11 ENCOUNTER — Emergency Department: Payer: 59

## 2019-12-11 ENCOUNTER — Other Ambulatory Visit: Payer: Self-pay

## 2019-12-11 ENCOUNTER — Emergency Department
Admission: EM | Admit: 2019-12-11 | Discharge: 2019-12-11 | Disposition: A | Discharge status: Home / Self Care | Payer: 59 | Attending: Emergency Medicine | Admitting: Emergency Medicine

## 2019-12-11 DIAGNOSIS — G44209 Tension-type headache, unspecified, not intractable: Secondary | ICD-10-CM | POA: Insufficient documentation

## 2019-12-11 DIAGNOSIS — E041 Nontoxic single thyroid nodule: Secondary | ICD-10-CM | POA: Diagnosis not present

## 2019-12-11 DIAGNOSIS — Z87891 Personal history of nicotine dependence: Secondary | ICD-10-CM | POA: Insufficient documentation

## 2019-12-11 DIAGNOSIS — R519 Headache, unspecified: Secondary | ICD-10-CM | POA: Diagnosis present

## 2019-12-11 LAB — CBC WITH DIFFERENTIAL/PLATELET
Abs Immature Granulocytes: 0.03 10*3/uL (ref 0.00–0.07)
Basophils Absolute: 0.1 10*3/uL (ref 0.0–0.1)
Basophils Relative: 1 %
Eosinophils Absolute: 0.1 10*3/uL (ref 0.0–0.5)
Eosinophils Relative: 2 %
HCT: 37.8 % (ref 36.0–46.0)
Hemoglobin: 12.3 g/dL (ref 12.0–15.0)
Immature Granulocytes: 1 %
Lymphocytes Relative: 25 %
Lymphs Abs: 1.5 10*3/uL (ref 0.7–4.0)
MCH: 30.4 pg (ref 26.0–34.0)
MCHC: 32.5 g/dL (ref 30.0–36.0)
MCV: 93.3 fL (ref 80.0–100.0)
Monocytes Absolute: 0.6 10*3/uL (ref 0.1–1.0)
Monocytes Relative: 10 %
Neutro Abs: 3.7 10*3/uL (ref 1.7–7.7)
Neutrophils Relative %: 61 %
Platelets: 210 10*3/uL (ref 150–400)
RBC: 4.05 MIL/uL (ref 3.87–5.11)
RDW: 13.2 % (ref 11.5–15.5)
WBC: 6 10*3/uL (ref 4.0–10.5)
nRBC: 0 % (ref 0.0–0.2)

## 2019-12-11 LAB — COMPREHENSIVE METABOLIC PANEL
ALT: 17 U/L (ref 0–44)
AST: 17 U/L (ref 15–41)
Albumin: 4.2 g/dL (ref 3.5–5.0)
Alkaline Phosphatase: 58 U/L (ref 38–126)
Anion gap: 8 (ref 5–15)
BUN: 16 mg/dL (ref 6–20)
CO2: 25 mmol/L (ref 22–32)
Calcium: 9.6 mg/dL (ref 8.9–10.3)
Chloride: 106 mmol/L (ref 98–111)
Creatinine, Ser: 0.68 mg/dL (ref 0.44–1.00)
GFR calc Af Amer: >60 mL/min (ref >60)
GFR calc non Af Amer: >60 mL/min (ref >60)
Glucose, Bld: 105 mg/dL — ABNORMAL HIGH (ref 70–99)
Potassium: 4.2 mmol/L (ref 3.5–5.1)
Sodium: 139 mmol/L (ref 135–145)
Total Bilirubin: 0.8 mg/dL (ref 0.3–1.2)
Total Protein: 8.2 g/dL — ABNORMAL HIGH (ref 6.5–8.1)

## 2019-12-11 LAB — SEDIMENTATION RATE: Sed Rate: 28 mm/hr — ABNORMAL HIGH (ref 0–20)

## 2019-12-11 MED ORDER — KETOROLAC TROMETHAMINE 30 MG/ML IJ SOLN
15.0000 mg | INTRAMUSCULAR | Status: AC
  Administered 2019-12-11: 15 mg via INTRAVENOUS
  Filled 2019-12-11: qty 1

## 2019-12-11 MED ORDER — METOCLOPRAMIDE HCL 10 MG PO TABS
10.0000 mg | ORAL_TABLET | Freq: Four times a day (QID) | ORAL | 0 refills | Status: DC | PRN
Start: 2019-12-11 — End: 2021-06-14

## 2019-12-11 MED ORDER — NAPROXEN 500 MG PO TABS
500.0000 mg | ORAL_TABLET | Freq: Two times a day (BID) | ORAL | 0 refills | Status: DC
Start: 2019-12-11 — End: 2019-12-15

## 2019-12-11 MED ORDER — SODIUM CHLORIDE 0.9 % IV BOLUS
1000.0000 mL | Freq: Once | INTRAVENOUS | Status: AC
  Administered 2019-12-11: 1000 mL via INTRAVENOUS

## 2019-12-11 MED ORDER — DIPHENHYDRAMINE HCL 25 MG PO CAPS: 50.0000 mg | ORAL_CAPSULE | Freq: Four times a day (QID) | ORAL | 0 refills | Status: AC | PRN

## 2019-12-11 MED ORDER — DIPHENHYDRAMINE HCL 50 MG/ML IJ SOLN
25.0000 mg | Freq: Once | INTRAMUSCULAR | Status: AC
  Administered 2019-12-11: 25 mg via INTRAVENOUS
  Filled 2019-12-11: qty 1

## 2019-12-11 MED ORDER — IOHEXOL 350 MG/ML SOLN
75.0000 mL | Freq: Once | INTRAVENOUS | Status: AC | PRN
  Administered 2019-12-11: 75 mL via INTRAVENOUS

## 2019-12-11 MED ORDER — METOCLOPRAMIDE HCL 5 MG/ML IJ SOLN
10.0000 mg | Freq: Once | INTRAMUSCULAR | Status: AC
  Administered 2019-12-11: 10 mg via INTRAVENOUS
  Filled 2019-12-11: qty 2

## 2019-12-11 NOTE — ED Provider Notes (Signed)
[facility] Emergency Department Provider Note  ____________________________________________  Time seen: Approximately 7:41 AM  I have reviewed the triage vital signs and the nursing notes.   HISTORY  Chief Complaint Headache    HPI Dana West is a 45 y.o. female with a history of diverticulitis, uterine fibroids who comes the ED complaining of right-sided headache extending from the right forehead and TMJ down to her cheek and lower mandible.  Worse with biting and chewing.  Associated with occasional tenderness.  Waxing and waning, no aggravating or alleviating factors.  She reports the onset is acute 5 days ago and persistent.  No fevers chills vision changes paresthesias weakness or tingling.  No vomiting or nausea.  No change in balance or coordination.  No change in hearing .  No neck stiffness  Denies history of migraine or trauma.    Past Medical History:  Diagnosis Date  . Abnormal uterine bleeding 01/12/2015  . Anemia   . Bowel perforation ([place])   . Diverticulitis of large intestine with perforation and abscess 04/04/2017  . 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 Intramural leiomyoma of uterus 01/11/2015  . Menometrorrhagia 04/18/2014  . Obesity (BMI 35.0-39.9 without comorbidity)   . Perforated diverticulum of large intestine   . Submucous leiomyoma of uterus 04/18/2014  . Subserous leiomyoma of uterus 03/16/2017     Patient Active Problem List   Diagnosis Date Noted  . 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 Abnormal uterine bleeding 01/12/2015  . 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 Intramural leiomyoma of uterus 01/11/2015  . 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 Menometrorrhagia 04/18/2014  . Submucous leiomyoma of uterus 04/18/2014     Past Surgical History:   Procedure Laterality Date  . ABDOMINAL HYSTERECTOMY  2017  . COLECTOMY WITH COLOSTOMY CREATION/HARTMANN PROCEDURE N/A 04/03/2017   Procedure: SIGMOID COLECTOMY WITH COLOSTOMY CREATION/HARTMANN PROCEDURE;  Surgeon: Vickie Epley, MD;  Location: [facility] ORS;  Service: General;  Laterality: N/A;  . COLONOSCOPY WITH PROPOFOL N/A 07/21/2017   Procedure: COLONOSCOPY WITH PROPOFOL;  Surgeon: Lin Landsman, MD;  Location: [facility] ENDOSCOPY;  Service: Gastroenterology;  Laterality: N/A;  . COLOSTOMY REVERSAL N/A 08/20/2017   Procedure: COLOSTOMY REVERSAL;  Surgeon: Vickie Epley, MD;  Location: [facility] ORS;  Service: General;  Laterality: N/A;  . UTERINE FIBROID SURGERY       Prior to Admission medications   Medication Sig Start Date End Date Taking? Authorizing Provider  diphenhydrAMINE (BENADRYL) 25 mg capsule Take 2 capsules (50 mg total) by mouth every 6 (six) hours as needed. 12/11/19   Carrie Mew, MD  ibuprofen (ADVIL,MOTRIN) 200 MG tablet Take 400 mg by mouth every 8 (eight) hours as needed (for pain.).    [provider]  metoCLOPramide (REGLAN) 10 MG tablet Take 1 tablet (10 mg total) by mouth every 6 (six) hours as needed. 12/11/19   Carrie Mew, MD  naproxen (NAPROSYN) 500 MG tablet Take 1 tablet (500 mg total) by mouth 2 (two) times daily with a meal. 12/11/19   Carrie Mew, MD  prednisoLONE acetate (PRED FORTE) 1 % ophthalmic suspension Place 1 drop into both eyes at bedtime.    [provider]     Allergies Hydrocodone-acetaminophen, Raspberry, and Valium [diazepam]   Family History  Problem Relation Age of Onset  . Alcohol abuse Neg Hx   .  Arthritis Neg Hx   . Asthma Neg Hx   . Birth defects Neg Hx   . Cancer Neg Hx   . COPD Neg Hx   . Depression Neg Hx   . Diabetes Neg Hx   . Drug abuse Neg Hx   . Early death Neg Hx   . Hearing loss Neg Hx   . Heart disease Neg Hx   . Hyperlipidemia Neg Hx   . Hypertension Neg Hx   . Kidney disease  Neg Hx   . Learning disabilities Neg Hx   . Mental illness Neg Hx   . Mental retardation Neg Hx   . Miscarriages / Stillbirths Neg Hx   . Stroke Neg Hx   . Vision loss Neg Hx   . Varicose Veins Neg Hx     Social History Social History   Tobacco Use  . Smoking status: Former Smoker    Quit date: 07/13/2017    Years since quitting: 2.4  . Smokeless tobacco: Never Used  Vaping Use  . Vaping Use: Never used  Substance Use Topics  . Alcohol use: No    Comment: seldom  . Drug use: No    Review of Systems  Constitutional:   No fever or chills.  ENT:   No sore throat. No rhinorrhea. Cardiovascular:   No chest pain or syncope. Respiratory:   No dyspnea or cough. Gastrointestinal:   Negative for abdominal pain, vomiting and diarrhea.  Musculoskeletal:   Negative for focal pain or swelling All other systems reviewed and are negative except as documented above in ROS and HPI.  ____________________________________________   PHYSICAL EXAM:  VITAL SIGNS: ED Triage Vitals  Enc Vitals Group     BP 12/11/19 0316 131/86     Pulse Rate 12/11/19 0316 70     Resp 12/11/19 0316 18     Temp 12/11/19 0316 99 F (37.2 C)     Temp Source 12/11/19 0316 Oral     SpO2 12/11/19 0316 100 %     Weight 12/11/19 0317 218 lb (98.9 kg)     Height 12/11/19 0317 5\' 7"  (1.702 m)     Head Circumference --      Peak Flow --      Pain Score 12/11/19 0317 10     Pain Loc --      Pain Edu? --      Excl. in [place]? --     Vital signs reviewed, nursing assessments reviewed.   Constitutional:   Alert and oriented. Non-toxic appearance. Eyes:   Conjunctivae are normal. EOMI. PERRL.  No nystagmus ENT      Head:   Normocephalic and atraumatic.  There is tenderness at the right TMJ.      Nose: Normal      Mouth/Throat:   Normal, moist mucosa, no gingival swelling or dental decay or fracture      Neck:   No meningismus. Full ROM. Hematological/Lymphatic/Immunilogical:   Scattered right submandibular  cervical lymphadenopathy Cardiovascular:   RRR. Symmetric bilateral radial and DP pulses.  No murmurs. Cap refill less than 2 seconds. Respiratory:   Normal respiratory effort without tachypnea/retractions. Breath sounds are clear and equal bilaterally. No wheezes/rales/rhonchi. Gastrointestinal:   Soft and nontender. Non distended. There is no CVA tenderness.  No rebound, rigidity, or guarding. Musculoskeletal:   Normal range of motion in all extremities. No joint effusions.  No lower extremity tenderness.  No edema. Neurologic:   Normal speech and language.  Motor grossly  intact.  Cerebellar function intact No acute focal neurologic deficits are appreciated.  Skin:    Skin is warm, dry and intact. No rash noted.  No petechiae, purpura, or bullae.  ____________________________________________    LABS (pertinent positives/negatives) (all labs ordered are listed, but only abnormal results are displayed) Labs Reviewed  COMPREHENSIVE METABOLIC PANEL - Abnormal; Notable for the following components:      Result Value   Glucose, Bld 105 (*)    Total Protein 8.2 (*)    All other components within normal limits  SEDIMENTATION RATE - Abnormal; Notable for the following components:   Sed Rate 28 (*)    All other components within normal limits  CBC WITH DIFFERENTIAL/PLATELET   ____________________________________________   EKG    ____________________________________________    RADIOLOGY  CT Angio Head W or Wo Contrast  Result Date: 12/11/2019 CLINICAL DATA:  Acute headache.  Normal neuro exam. EXAM: CT ANGIOGRAPHY HEAD AND NECK TECHNIQUE: Multidetector CT imaging of the head and neck was performed using the standard protocol during bolus administration of intravenous contrast. Multiplanar CT image reconstructions and MIPs were obtained to evaluate the vascular anatomy. Carotid stenosis measurements (when applicable) are obtained utilizing NASCET criteria, using the distal internal  carotid diameter as the denominator. CONTRAST:  15mL OMNIPAQUE IOHEXOL 350 MG/ML SOLN COMPARISON:  CT head 12/11/2019 FINDINGS: CTA NECK FINDINGS Aortic arch: Bovine branching. Aortic arch and proximal great vessels normal. Right carotid system: Normal right carotid without atherosclerotic disease or dissection. Left carotid system: Normal left carotid without atherosclerotic disease or dissection. Vertebral arteries: Both vertebral arteries are patent to the basilar without stenosis. Skeleton: Negative Other neck: Diffuse thyroid enlargement bilaterally. 2.2 cm left lower pole nodule. Multiple additional subcentimeter nodules bilaterally. No adenopathy in the neck. Upper chest: Prominent soft tissue density in the anterior mediastinum which may represent enlarged thymus. This is incompletely evaluated. Lung apices clear bilaterally. Review of the MIP images confirms the above findings CTA HEAD FINDINGS Anterior circulation: Cavernous carotid normal bilaterally. No stenosis or aneurysm. Anterior and middle cerebral arteries widely patent bilaterally without stenosis or aneurysm. Posterior circulation: Both vertebral arteries patent to the basilar. PICA patent bilaterally. Basilar widely patent. Superior cerebellar and posterior cerebral arteries patent bilaterally without stenosis or aneurysm. Venous sinuses: Normal venous enhancement Anatomic variants: None Review of the MIP images confirms the above findings IMPRESSION: 1. Negative CTA head neck.  No arterial stenosis or obstruction. 2. Negative for cerebral aneurysm or vascular malformation 3. Increased soft tissue in the anterior mediastinum compatible with enlarged thymus. 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 Recommend CT chest with contrast for further evaluation. 4. Thyroid enlargement and bilateral thyroid nodules compatible with goiter. 2.2 cm left thyroid nodule. Thyroid ultrasound recommended. (Ref: J Am Coll Radiol. 2015 Feb;12(2): 143-50). Electronically Signed   By:  Franchot Gallo M.D.   On: 12/11/2019 08:40   CT Head Wo Contrast  Result Date: 12/11/2019 CLINICAL DATA:  Cerebral imaged suspected. Headache beginning last week. EXAM: CT HEAD WITHOUT CONTRAST TECHNIQUE: Contiguous axial images were obtained from the base of the skull through the vertex without intravenous contrast. COMPARISON:  None. FINDINGS: Brain: No subdural, epidural, or subarachnoid hemorrhage identified. Cerebellum, brainstem, and basal cisterns are normal. Ventricles and sulci are normal. No mass effect or midline shift. No cortical ischemia or infarct. Vascular: No hyperdense vessel or unexpected calcification. Skull: Normal. Negative for fracture or focal lesion. Sinuses/Orbits: No acute finding. Other: None. IMPRESSION: 1. No acute intracranial abnormality to explain the patient's headache. No hemorrhage  noted. Electronically Signed   By: Dorise Bullion III M.D   On: 12/11/2019 04:16   CT Angio Neck W and/or Wo Contrast  Result Date: 12/11/2019 CLINICAL DATA:  Acute headache.  Normal neuro exam. EXAM: CT ANGIOGRAPHY HEAD AND NECK TECHNIQUE: Multidetector CT imaging of the head and neck was performed using the standard protocol during bolus administration of intravenous contrast. Multiplanar CT image reconstructions and MIPs were obtained to evaluate the vascular anatomy. Carotid stenosis measurements (when applicable) are obtained utilizing NASCET criteria, using the distal internal carotid diameter as the denominator. CONTRAST:  83mL OMNIPAQUE IOHEXOL 350 MG/ML SOLN COMPARISON:  CT head 12/11/2019 FINDINGS: CTA NECK FINDINGS Aortic arch: Bovine branching. Aortic arch and proximal great vessels normal. Right carotid system: Normal right carotid without atherosclerotic disease or dissection. Left carotid system: Normal left carotid without atherosclerotic disease or dissection. Vertebral arteries: Both vertebral arteries are patent to the basilar without stenosis. Skeleton: Negative Other neck:  Diffuse thyroid enlargement bilaterally. 2.2 cm left lower pole nodule. Multiple additional subcentimeter nodules bilaterally. No adenopathy in the neck. Upper chest: Prominent soft tissue density in the anterior mediastinum which may represent enlarged thymus. This is incompletely evaluated. Lung apices clear bilaterally. Review of the MIP images confirms the above findings CTA HEAD FINDINGS Anterior circulation: Cavernous carotid normal bilaterally. No stenosis or aneurysm. Anterior and middle cerebral arteries widely patent bilaterally without stenosis or aneurysm. Posterior circulation: Both vertebral arteries patent to the basilar. PICA patent bilaterally. Basilar widely patent. Superior cerebellar and posterior cerebral arteries patent bilaterally without stenosis or aneurysm. Venous sinuses: Normal venous enhancement Anatomic variants: None Review of the MIP images confirms the above findings IMPRESSION: 1. Negative CTA head neck.  No arterial stenosis or obstruction. 2. Negative for cerebral aneurysm or vascular malformation 3. Increased soft tissue in the anterior mediastinum compatible with enlarged thymus. 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 Recommend CT chest with contrast for further evaluation. 4. Thyroid enlargement and bilateral thyroid nodules compatible with goiter. 2.2 cm left thyroid nodule. Thyroid ultrasound recommended. (Ref: J Am Coll Radiol. 2015 Feb;12(2): 143-50). Electronically Signed   By: Franchot Gallo M.D.   On: 12/11/2019 08:40    ____________________________________________   PROCEDURES Procedures  ____________________________________________  DIFFERENTIAL DIAGNOSIS   Cerebral aneurysm, carotid dissection, sinusitis, URI with reactive lymphadenopathy  CLINICAL IMPRESSION / ASSESSMENT AND PLAN / ED COURSE  Medications ordered in the ED: Medications  metoCLOPramide (REGLAN) injection 10 mg (10 mg Intravenous Given 12/11/19 0833)  diphenhydrAMINE (BENADRYL) injection 25 mg (25 mg  Intravenous Given 12/11/19 0832)  ketorolac (TORADOL) 30 MG/ML injection 15 mg (15 mg Intravenous Given 12/11/19 0832)  sodium chloride 0.9 % bolus 1,000 mL (0 mLs Intravenous Stopped 12/11/19 0951)  iohexol (OMNIPAQUE) 350 MG/ML injection 75 mL (75 mLs Intravenous Contrast Given 12/11/19 0759)    Pertinent labs & imaging results that were available during my care of the patient were reviewed by me and considered in my medical decision making (see chart for details).  Dana West was evaluated in Emergency Department on 12/11/2019 for the symptoms described in the history of present illness. She was evaluated in the context of the global COVID-19 pandemic, which necessitated consideration that the patient might be at risk for infection with the SARS-CoV-2 virus that causes COVID-19. Institutional protocols and algorithms that pertain to the evaluation of patients at risk for COVID-19 are in a state of rapid change based on information released by regulatory bodies including the CDC and federal and state organizations. These policies and  algorithms were followed during the patient's care in the ED.   Patient presents with right-sided headache, some lymphadenopathy.  Vital signs are normal, she is nontoxic.  Noncontrast CT head and labs are all reassuring.  Due to the acute onset, radiating pain, will obtain CT angiogram of the head and neck to evaluate for vascular pathology. Give IVF for hydration, reglan/Benadryl/Toradol for headache relief  ----------------------------------------- 9:26 AM on 12/11/2019 -----------------------------------------  Patient sleeping comfortably.  Vital signs normal.  CT scans negative for acute vascular finding, but do show thyroid nodule/goiter and enlarged thymus.  We will plan for follow-up with oncology for further work-up if patient does not have a primary care doctor.  Clinical Course as of Dec 10 1056  Sun Dec 11, 2019  1053 Results discussed with the  patient, advised need for follow-up.  She confirms she does not have a primary care doctor.  I discussed with oncology Dr. Rogue Bussing who will have the oncology clinic reach out to patient to schedule follow-up. Patient symptoms have resolved at this time, and she is awake and alert and not showing signs of sedation from medications.  She is stable for discharge home.   [PS]    Clinical Course User Index [PS] Carrie Mew, MD     ____________________________________________   FINAL CLINICAL IMPRESSION(S) / ED DIAGNOSES    Final diagnoses:  Acute nonintractable headache, unspecified headache type  Thyroid nodule greater than or equal to 1.5 cm in diameter incidentally noted on imaging study     ED Discharge Orders         Ordered    diphenhydrAMINE (BENADRYL) 25 mg capsule  Every 6 hours PRN     Discontinue  Reprint     12/11/19 1057    metoCLOPramide (REGLAN) 10 MG tablet  Every 6 hours PRN     Discontinue  Reprint     12/11/19 1057    naproxen (NAPROSYN) 500 MG tablet  2 times daily with meals     Discontinue  Reprint     12/11/19 1057          Portions of this note were generated with dragon dictation software. Dictation errors may occur despite best attempts at proofreading.   Carrie Mew, MD 12/11/19 1058

## 2019-12-11 NOTE — ED Notes (Signed)
Patient being transported to CT at this time 

## 2019-12-11 NOTE — ED Triage Notes (Signed)
Pt states right sided headache that began last week that extends from forehead to ear, cheek and down to neck. Pt without nuchal rigidity, no visual changes, denies photophobia or sensitivity to sound. Pt denies vomiting, nausea, rash.

## 2019-12-11 NOTE — Discharge Instructions (Addendum)
Your scan today shows a nodule in the thyroid as well as possibly enlarged remnant of the thymus in your upper chest.  Please follow-up with the oncology clinic for further assessment.  They will contact you this week to schedule a follow-up appointment.  If you do not hear from them by Friday, please call the clinic yourself.  There is no signs of any serious vascular issues or aneurysms.  Your labs today were reassuring.  If the headache recurs, you can take naproxen, metoclopramide, and diphenhydramine to relieve it.  These should work like the medications we gave you in the emergency department.   CT scan summary: 1. Negative CTA head neck.  No arterial stenosis or obstruction.  2. Negative for cerebral aneurysm or vascular malformation  3. Increased soft tissue in the anterior mediastinum compatible with  enlarged thymus. 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 Recommend CT chest with  contrast for further evaluation.  4. Thyroid enlargement and bilateral thyroid nodules compatible with  goiter. 2.2 cm left thyroid nodule. Thyroid ultrasound recommended.  (Ref: J Am Coll Radiol. 2015 Feb;12(2): 143-50).

## 2019-12-14 ENCOUNTER — Other Ambulatory Visit: Payer: Self-pay

## 2019-12-14 ENCOUNTER — Encounter: Payer: Self-pay | Admitting: Internal Medicine

## 2019-12-14 NOTE — Progress Notes (Signed)
Spoke with patient prior to her New patient apt with Dr. Rogue Bussing. introduced oncology services to the patient. Patient reports increase in chest discomfort. She would like a RF on the Reglan if possible when she sees Dr. Rogue Bussing.

## 2019-12-15 ENCOUNTER — Other Ambulatory Visit: Payer: Self-pay | Admitting: *Deleted

## 2019-12-15 ENCOUNTER — Telehealth: Payer: Self-pay | Admitting: *Deleted

## 2019-12-15 ENCOUNTER — Inpatient Hospital Stay: Payer: PRIVATE HEALTH INSURANCE | Attending: Internal Medicine | Admitting: Internal Medicine

## 2019-12-15 ENCOUNTER — Inpatient Hospital Stay: Payer: PRIVATE HEALTH INSURANCE

## 2019-12-15 ENCOUNTER — Encounter: Payer: Self-pay | Admitting: Internal Medicine

## 2019-12-15 VITALS — BP 127/76 | HR 68 | Temp 98.6°F | Resp 18 | Ht 67.0 in | Wt 228.0 lb

## 2019-12-15 DIAGNOSIS — E042 Nontoxic multinodular goiter: Secondary | ICD-10-CM

## 2019-12-15 DIAGNOSIS — R9389 Abnormal findings on diagnostic imaging of other specified body structures: Secondary | ICD-10-CM | POA: Diagnosis not present

## 2019-12-15 DIAGNOSIS — R519 Headache, unspecified: Secondary | ICD-10-CM | POA: Diagnosis not present

## 2019-12-15 DIAGNOSIS — E329 Disease of thymus, unspecified: Secondary | ICD-10-CM | POA: Insufficient documentation

## 2019-12-15 DIAGNOSIS — E079 Disorder of thyroid, unspecified: Secondary | ICD-10-CM | POA: Diagnosis present

## 2019-12-15 MED ORDER — NAPROXEN 500 MG PO TABS: 500.0000 mg | ORAL_TABLET | Freq: Two times a day (BID) | ORAL | 0 refills | Status: DC

## 2019-12-15 MED ORDER — BUTALBITAL-APAP-CAFFEINE 50-325-40 MG PO TABS: 1.0000 | ORAL_TABLET | Freq: Three times a day (TID) | ORAL | 0 refills | Status: AC | PRN

## 2019-12-15 NOTE — Progress Notes (Signed)
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     HISTORY OF PRESENTING ILLNESS:  Dana West 44 y.o.  female has been referred to [place] for further evaluation of thymus mass/thyroid nodules.  Patient noted to have headaches generalized over the last 1 week.  Progressively worse.  Denies any visual disturbance.  On her visit to the emergency room patient had a CT of the brain-negative for any acute process.  Also noted to have a CT angio neck-that showed approximately 3 cm thymic mass; also bilateral neck thyroid nodules up to 2.5 cm in size.   Patient unfortunately continues to have headaches.  Positive fatigue.   Patient does complain of stress working long hours as days at a [employer].  Review of Systems  Constitutional: Positive for malaise/fatigue and weight loss. Negative for chills, diaphoresis and fever.  HENT: Negative for nosebleeds and sore throat.   Eyes: Negative for double vision.  Respiratory: Negative for cough, hemoptysis, sputum production, shortness of breath and wheezing.   Cardiovascular: Negative for chest pain, palpitations, orthopnea and leg swelling.  Gastrointestinal: Positive for nausea and vomiting. Negative for abdominal pain, blood in stool, constipation, diarrhea, heartburn and melena.  Genitourinary: Negative for dysuria, frequency and urgency.  Musculoskeletal: Positive for joint pain. Negative for back pain.  Skin: Negative.  Negative for itching and rash.  Neurological: Positive for headaches. Negative for dizziness, tingling, focal weakness and weakness.  Endo/Heme/Allergies: Does not bruise/bleed easily.  Psychiatric/Behavioral: Negative for depression. The patient is  not nervous/anxious and does not have insomnia.      MEDICAL HISTORY:  Past Medical History:  Diagnosis Date  . Abnormal uterine bleeding 01/12/2015  . Anemia   . Bowel perforation ([place])   . Diverticulitis of large intestine with perforation and abscess 04/04/2017  . 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 Intramural leiomyoma of uterus 01/11/2015  . Menometrorrhagia 04/18/2014  . Obesity (BMI 35.0-39.9 without comorbidity)   . Perforated diverticulum of large intestine   . Submucous leiomyoma of uterus 04/18/2014  . Subserous leiomyoma of uterus 03/16/2017    SURGICAL HISTORY: Past Surgical History:  Procedure Laterality Date  . ABDOMINAL HYSTERECTOMY  2017  . COLECTOMY WITH COLOSTOMY CREATION/HARTMANN PROCEDURE N/A 04/03/2017   Procedure: SIGMOID COLECTOMY WITH COLOSTOMY CREATION/HARTMANN PROCEDURE;  Surgeon: Vickie Epley, MD;  Location: [facility] ORS;  Service: General;  Laterality: N/A;  . COLONOSCOPY WITH PROPOFOL N/A 07/21/2017   Procedure: COLONOSCOPY WITH PROPOFOL;  Surgeon: Lin Landsman, MD;  Location: [facility] ENDOSCOPY;  Service: Gastroenterology;  Laterality: N/A;  . COLOSTOMY REVERSAL N/A 08/20/2017   Procedure: COLOSTOMY REVERSAL;  Surgeon: Vickie Epley, MD;  Location: [facility] ORS;  Service: General;  Laterality: N/A;  . UTERINE FIBROID SURGERY      SOCIAL HISTORY: Social History   Socioeconomic History  . Marital status: Single    Spouse name: Not on file  . Number of children: Not on file  . Years of education: Not on file  . Highest education level: Not on file  Occupational History  . Not on file  Tobacco Use  . Smoking status: Former Smoker    Quit date: 07/13/2017  Years since quitting: 2.4  . Smokeless tobacco: Never Used  Vaping Use  . Vaping Use: Never used  Substance and Sexual Activity  . Alcohol use: No    Comment: seldom  . Drug use: No  . Sexual  activity: Never  Other Topics Concern  . Not on file  Social History Narrative   Works for [employer]. Smoke 1p- 1-2 months; ocassional alcohol. Lives in [place]; mother lives in her.    Social Determinants of Health   Financial Resource Strain:   . Difficulty of Paying Living Expenses:   Food Insecurity:   . Worried About Charity fundraiser in the Last Year:   . Arboriculturist in the Last Year:   Transportation Needs:   . Film/video editor (Medical):   Marland Kitchen Lack of Transportation (Non-Medical):   Physical Activity:   . Days of Exercise per Week:   . Minutes of Exercise per Session:   Stress:   . Feeling of Stress :   Social Connections:   . Frequency of Communication with Friends and Family:   . Frequency of Social Gatherings with Friends and Family:   . Attends Religious Services:   . Active Member of Clubs or Organizations:   . Attends Archivist Meetings:   Marland Kitchen Marital Status:   Intimate Partner Violence:   . Fear of Current or Ex-Partner:   . Emotionally Abused:   Marland Kitchen Physically Abused:   . Sexually Abused:     FAMILY HISTORY: Family History  Problem Relation Age of Onset  . Dementia Father   . Alcohol abuse Neg Hx   . Arthritis Neg Hx   . Asthma Neg Hx   . Birth defects Neg Hx   . Cancer Neg Hx   . COPD Neg Hx   . Depression Neg Hx   . Diabetes Neg Hx   . Drug abuse Neg Hx   . Early death Neg Hx   . Hearing loss Neg Hx   . Heart disease Neg Hx   . Hyperlipidemia Neg Hx   . Hypertension Neg Hx   . Kidney disease Neg Hx   . Learning disabilities Neg Hx   . Mental illness Neg Hx   . Mental retardation Neg Hx   . Miscarriages / Stillbirths Neg Hx   . Stroke Neg Hx   . Vision loss Neg Hx   . Varicose Veins Neg Hx     ALLERGIES:  is allergic to hydrocodone-acetaminophen, raspberry, and valium [diazepam].  MEDICATIONS:  Current Outpatient Medications  Medication Sig Dispense Refill  . diphenhydrAMINE (BENADRYL) 25 mg capsule Take 2  capsules (50 mg total) by mouth every 6 (six) hours as needed. 60 capsule 0  . ibuprofen (ADVIL,MOTRIN) 200 MG tablet Take 400 mg by mouth every 8 (eight) hours as needed (for pain.).    Marland Kitchen metoCLOPramide (REGLAN) 10 MG tablet Take 1 tablet (10 mg total) by mouth every 6 (six) hours as needed. 30 tablet 0  . prednisoLONE acetate (PRED FORTE) 1 % ophthalmic suspension Place 1 drop into both eyes at bedtime.    . butalbital-acetaminophen-caffeine (FIORICET) 50-325-40 MG tablet Take 1-2 tablets by mouth every 8 (eight) hours as needed for headache. 45 tablet 0  . naproxen (NAPROSYN) 500 MG tablet Take 1 tablet (500 mg total) by mouth 2 (two) times daily with a meal. 20 tablet 0   No current facility-administered medications for this visit.      Marland Kitchen  PHYSICAL EXAMINATION:  ECOG PERFORMANCE STATUS: 0 - Asymptomatic  Vitals:   12/15/19 0934  BP: 127/76  Pulse: 68  Resp: 18  Temp: 98.6 F (37 C)  SpO2: 100%   Filed Weights   12/15/19 0934  Weight: 228 lb (103.4 kg)    Physical Exam HENT:     Head: Normocephalic and atraumatic.     Mouth/Throat:     Pharynx: No oropharyngeal exudate.  Eyes:     Pupils: Pupils are equal, round, and reactive to light.  Cardiovascular:     Rate and Rhythm: Normal rate and regular rhythm.  Pulmonary:     Effort: No respiratory distress.     Breath sounds: No wheezing.  Abdominal:     General: Bowel sounds are normal. There is no distension.     Palpations: Abdomen is soft. There is no mass.     Tenderness: There is no abdominal tenderness. There is no guarding or rebound.  Musculoskeletal:        General: No tenderness. Normal range of motion.     Cervical back: Normal range of motion and neck supple.  Skin:    General: Skin is warm.  Neurological:     Mental Status: She is alert and oriented to person, place, and time.  Psychiatric:        Mood and Affect: Affect normal.      LABORATORY DATA:  I have reviewed the data as listed Lab  Results  Component Value Date   WBC 6.0 12/11/2019   HGB 12.3 12/11/2019   HCT 37.8 12/11/2019   MCV 93.3 12/11/2019   PLT 210 12/11/2019   Recent Labs    11/24/19 1116 12/11/19 0707  NA 139 139  K 4.0 4.2  CL 110 106  CO2 25 25  GLUCOSE 98 105*  BUN 19 16  CREATININE 0.72 0.68  CALCIUM 9.2 9.6  GFRNONAA >60 >60  GFRAA >60 >60  PROT 7.7 8.2*  ALBUMIN 3.8 4.2  AST 17 17  ALT 16 17  ALKPHOS 62 58  BILITOT 0.6 0.8    RADIOGRAPHIC STUDIES: I have personally reviewed the radiological images as listed and agreed with the findings in the report. CT Angio Head W or Wo Contrast  Result Date: 12/11/2019 CLINICAL DATA:  Acute headache.  Normal neuro exam. EXAM: CT ANGIOGRAPHY HEAD AND NECK TECHNIQUE: Multidetector CT imaging of the head and neck was performed using the standard protocol during bolus administration of intravenous contrast. Multiplanar CT image reconstructions and MIPs were obtained to evaluate the vascular anatomy. Carotid stenosis measurements (when applicable) are obtained utilizing NASCET criteria, using the distal internal carotid diameter as the denominator. CONTRAST:  29mL OMNIPAQUE IOHEXOL 350 MG/ML SOLN COMPARISON:  CT head 12/11/2019 FINDINGS: CTA NECK FINDINGS Aortic arch: Bovine branching. Aortic arch and proximal great vessels normal. Right carotid system: Normal right carotid without atherosclerotic disease or dissection. Left carotid system: Normal left carotid without atherosclerotic disease or dissection. Vertebral arteries: Both vertebral arteries are patent to the basilar without stenosis. Skeleton: Negative Other neck: Diffuse thyroid enlargement bilaterally. 2.2 cm left lower pole nodule. Multiple additional subcentimeter nodules bilaterally. No adenopathy in the neck. Upper chest: Prominent soft tissue density in the anterior mediastinum which may represent enlarged thymus. This is incompletely evaluated. Lung apices clear bilaterally. Review of the MIP  images confirms the above findings CTA HEAD FINDINGS Anterior circulation: Cavernous carotid normal bilaterally. No stenosis or aneurysm. Anterior and middle cerebral arteries widely patent bilaterally without stenosis or aneurysm. Posterior circulation:  Both vertebral arteries patent to the basilar. PICA patent bilaterally. Basilar widely patent. Superior cerebellar and posterior cerebral arteries patent bilaterally without stenosis or aneurysm. Venous sinuses: Normal venous enhancement Anatomic variants: None Review of the MIP images confirms the above findings IMPRESSION: 1. Negative CTA head neck.  No arterial stenosis or obstruction. 2. Negative for cerebral aneurysm or vascular malformation 3. Increased soft tissue in the anterior mediastinum compatible with enlarged thymus. 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 Recommend CT chest with contrast for further evaluation. 4. Thyroid enlargement and bilateral thyroid nodules compatible with goiter. 2.2 cm left thyroid nodule. Thyroid ultrasound recommended. (Ref: J Am Coll Radiol. 2015 Feb;12(2): 143-50). Electronically Signed   By: Franchot Gallo M.D.   On: 12/11/2019 08:40   CT Head Wo Contrast  Result Date: 12/11/2019 CLINICAL DATA:  Cerebral imaged suspected. Headache beginning last week. EXAM: CT HEAD WITHOUT CONTRAST TECHNIQUE: Contiguous axial images were obtained from the base of the skull through the vertex without intravenous contrast. COMPARISON:  None. FINDINGS: Brain: No subdural, epidural, or subarachnoid hemorrhage identified. Cerebellum, brainstem, and basal cisterns are normal. Ventricles and sulci are normal. No mass effect or midline shift. No cortical ischemia or infarct. Vascular: No hyperdense vessel or unexpected calcification. Skull: Normal. Negative for fracture or focal lesion. Sinuses/Orbits: No acute finding. Other: None. IMPRESSION: 1. No acute intracranial abnormality to explain the patient's headache. No hemorrhage noted. Electronically Signed    By: Dorise Bullion III M.D   On: 12/11/2019 04:16   CT Angio Neck W and/or Wo Contrast  Result Date: 12/11/2019 CLINICAL DATA:  Acute headache.  Normal neuro exam. EXAM: CT ANGIOGRAPHY HEAD AND NECK TECHNIQUE: Multidetector CT imaging of the head and neck was performed using the standard protocol during bolus administration of intravenous contrast. Multiplanar CT image reconstructions and MIPs were obtained to evaluate the vascular anatomy. Carotid stenosis measurements (when applicable) are obtained utilizing NASCET criteria, using the distal internal carotid diameter as the denominator. CONTRAST:  25mL OMNIPAQUE IOHEXOL 350 MG/ML SOLN COMPARISON:  CT head 12/11/2019 FINDINGS: CTA NECK FINDINGS Aortic arch: Bovine branching. Aortic arch and proximal great vessels normal. Right carotid system: Normal right carotid without atherosclerotic disease or dissection. Left carotid system: Normal left carotid without atherosclerotic disease or dissection. Vertebral arteries: Both vertebral arteries are patent to the basilar without stenosis. Skeleton: Negative Other neck: Diffuse thyroid enlargement bilaterally. 2.2 cm left lower pole nodule. Multiple additional subcentimeter nodules bilaterally. No adenopathy in the neck. Upper chest: Prominent soft tissue density in the anterior mediastinum which may represent enlarged thymus. This is incompletely evaluated. Lung apices clear bilaterally. Review of the MIP images confirms the above findings CTA HEAD FINDINGS Anterior circulation: Cavernous carotid normal bilaterally. No stenosis or aneurysm. Anterior and middle cerebral arteries widely patent bilaterally without stenosis or aneurysm. Posterior circulation: Both vertebral arteries patent to the basilar. PICA patent bilaterally. Basilar widely patent. Superior cerebellar and posterior cerebral arteries patent bilaterally without stenosis or aneurysm. Venous sinuses: Normal venous enhancement Anatomic variants: None  Review of the MIP images confirms the above findings IMPRESSION: 1. Negative CTA head neck.  No arterial stenosis or obstruction. 2. Negative for cerebral aneurysm or vascular malformation 3. Increased soft tissue in the anterior mediastinum compatible with enlarged thymus. 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 Recommend CT chest with contrast for further evaluation. 4. Thyroid enlargement and bilateral thyroid nodules compatible with goiter. 2.2 cm left thyroid nodule. Thyroid ultrasound recommended. (Ref: J Am Coll Radiol. 2015 Feb;12(2): 143-50). Electronically Signed   By: Franchot Gallo  M.D.   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  Discussed the differential diagnosis includes enlarged thymus vs-thymic tumors-thymoma versus carcinoma.  Would recommend a CT scan chest with contrast for further evaluation.  Discussed that based on imaging characteristics biopsy could potentially be recommended.  #Thyroid nodules-would recommend thyroid work-up; ultrasound neck; thyroid profile.  Discussed that would recommend biopsy based upon ultrasound results.  # Headaches- ? Migraines vs. others.  CT brain/angio neck-negative for any acute process.  Recommend butalbital every 8 hours as needed.  Recommend continue naproxen.  If pain does not improve would recommend follow-up with PCP/urgent care.  Thank you for allowing me to participate in the care of your pleasant patient. Please do not hesitate to contact me with questions or concerns in the interim.  # DISPOSITION: # Labs today- Thyroid profile- please order # CT chest 1-2 week # US neck 1-2 weeks # follow up in 2 weeks- MD; no labs  # I reviewed the blood work- with the patient in detail; also reviewed the imaging independently [as summarized above]; and with the patient in detail.    All questions were answered. The patient knows to call the clinic with any problems, questions or  concerns.     Cammie Sickle, MD 12/18/2019 9:10 AM

## 2019-12-15 NOTE — Telephone Encounter (Signed)
Spoke with patient. md recommended pt to take the butalbital and naproxen together. Pt instructed if no better then, she will need to go to the urgent care for further recommendations for the headaches. Patient states that she just took the medications together a few moments ago to see if this will give her relief. She gave verbal understanding of the plan of care.

## 2019-12-15 NOTE — Assessment & Plan Note (Addendum)
#  Thymic mass noted-incidentally on CT neck.  Discussed the differential diagnosis includes enlarged thymus vs-thymic tumors-thymoma versus carcinoma.  Would recommend a CT scan chest with contrast for further evaluation.  Discussed that based on imaging characteristics biopsy could potentially be recommended.  #Thyroid nodules-would recommend thyroid work-up; ultrasound neck; thyroid profile.  Discussed that would recommend biopsy based upon ultrasound results.  # Headaches- ? Migraines vs. others.  CT brain/angio neck-negative for any acute process.  Recommend butalbital every 8 hours as needed.  Recommend continue naproxen.  If pain does not improve would recommend follow-up with PCP/urgent care.  Thank you for allowing me to participate in the care of your pleasant patient. Please do not hesitate to contact me with questions or concerns in the interim.  # DISPOSITION: # Labs today- Thyroid profile- please order # CT chest 1-2 week # US neck 1-2 weeks # follow up in 2 weeks- MD; no labs  # I reviewed the blood work- with the patient in detail; also reviewed the imaging independently [as summarized above]; and with the patient in detail.

## 2019-12-15 NOTE — Telephone Encounter (Signed)
Please inform pt that she could take both butalbital and naproxen together for her headaches. If not improved she can go to urgent care for further treatment options.-msg from Dr. Rogue Bussing

## 2019-12-15 NOTE — Telephone Encounter (Signed)
Patient called stating that the new medicine given to her this morning is not working and that she is still having to take the Naproxen she was given in the ER. Please advise

## 2019-12-16 LAB — THYROID PANEL WITH TSH
Free Thyroxine Index: 2.1 (ref 1.2–4.9)
T3 Uptake Ratio: 28 % (ref 24–39)
T4, Total: 7.4 ug/dL (ref 4.5–12.0)
TSH: 2.78 u[IU]/mL (ref 0.450–4.500)

## 2019-12-27 ENCOUNTER — Ambulatory Visit
Admission: RE | Admit: 2019-12-27 | Discharge: 2019-12-27 | Disposition: A | Discharge status: Home / Self Care | Payer: 59 | Source: Ambulatory Visit | Attending: Internal Medicine | Admitting: Internal Medicine

## 2019-12-27 ENCOUNTER — Other Ambulatory Visit: Payer: Self-pay

## 2019-12-27 DIAGNOSIS — E329 Disease of thymus, unspecified: Secondary | ICD-10-CM

## 2019-12-27 DIAGNOSIS — E042 Nontoxic multinodular goiter: Secondary | ICD-10-CM | POA: Diagnosis present

## 2019-12-27 MED ORDER — IOHEXOL 300 MG/ML  SOLN
75.0000 mL | Freq: Once | INTRAMUSCULAR | Status: AC | PRN
  Administered 2019-12-27: 75 mL via INTRAVENOUS

## 2019-12-28 ENCOUNTER — Encounter: Payer: Self-pay | Admitting: Internal Medicine

## 2019-12-28 NOTE — Progress Notes (Signed)
Patient coming in for results. Worried about getting results.

## 2019-12-29 ENCOUNTER — Inpatient Hospital Stay (HOSPITAL_BASED_OUTPATIENT_CLINIC_OR_DEPARTMENT_OTHER): Payer: PRIVATE HEALTH INSURANCE | Admitting: Internal Medicine

## 2019-12-29 ENCOUNTER — Other Ambulatory Visit: Payer: Self-pay

## 2019-12-29 DIAGNOSIS — E329 Disease of thymus, unspecified: Secondary | ICD-10-CM

## 2019-12-29 DIAGNOSIS — E079 Disorder of thyroid, unspecified: Secondary | ICD-10-CM | POA: Diagnosis not present

## 2019-12-29 NOTE — Progress Notes (Signed)
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     HISTORY OF PRESENTING ILLNESS:  Dana West 44 y.o.  female is here for follow-up of thymus mass/thyroid nodules-to review results of the thyroid ultrasound/CT scan chest  Patient notes to have improvement of the headaches since she took a break from her work.  She has not been needing to take any pain pills.   Review of Systems  Constitutional: Positive for malaise/fatigue and weight loss. Negative for chills, diaphoresis and fever.  HENT: Negative for nosebleeds and sore throat.   Eyes: Negative for double vision.  Respiratory: Negative for cough, hemoptysis, sputum production, shortness of breath and wheezing.   Cardiovascular: Negative for chest pain, palpitations, orthopnea and leg swelling.  Gastrointestinal: Positive for nausea and vomiting. Negative for abdominal pain, blood in stool, constipation, diarrhea, heartburn and melena.  Genitourinary: Negative for dysuria, frequency and urgency.  Musculoskeletal: Positive for joint pain. Negative for back pain.  Skin: Negative.  Negative for itching and rash.  Neurological: Positive for headaches. Negative for dizziness, tingling, focal weakness and weakness.  Endo/Heme/Allergies: Does not bruise/bleed easily.  Psychiatric/Behavioral: Negative for depression. The patient is not nervous/anxious and does not have insomnia.      MEDICAL HISTORY:  Past Medical History:  Diagnosis Date  . Abnormal uterine bleeding 01/12/2015  . Anemia   . Bowel perforation ([place])   . Diverticulitis of large intestine with perforation and abscess 04/04/2017  .  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 Intramural leiomyoma of uterus 01/11/2015  . Menometrorrhagia 04/18/2014  . Obesity (BMI 35.0-39.9 without comorbidity)   . Perforated diverticulum of large intestine   . Submucous leiomyoma of uterus 04/18/2014  . Subserous leiomyoma of uterus 03/16/2017    SURGICAL HISTORY: Past Surgical History:  Procedure Laterality Date  . ABDOMINAL HYSTERECTOMY  2017  . COLECTOMY WITH COLOSTOMY CREATION/HARTMANN PROCEDURE N/A 04/03/2017   Procedure: SIGMOID COLECTOMY WITH COLOSTOMY CREATION/HARTMANN PROCEDURE;  Surgeon: Vickie Epley, MD;  Location: [facility] ORS;  Service: General;  Laterality: N/A;  . COLONOSCOPY WITH PROPOFOL N/A 07/21/2017   Procedure: COLONOSCOPY WITH PROPOFOL;  Surgeon: Lin Landsman, MD;  Location: [facility] ENDOSCOPY;  Service: Gastroenterology;  Laterality: N/A;  . COLOSTOMY REVERSAL N/A 08/20/2017   Procedure: COLOSTOMY REVERSAL;  Surgeon: Vickie Epley, MD;  Location: [facility] ORS;  Service: General;  Laterality: N/A;  . UTERINE FIBROID SURGERY      SOCIAL HISTORY: Social History   Socioeconomic History  . Marital status: Single    Spouse name: Not on file  . Number of children: Not on file  . Years of education: Not on file  . Highest education level: Not on file  Occupational History  . Not on file  Tobacco Use  . Smoking status: Former Smoker    Quit date: 07/13/2017    Years since quitting: 2.4  . Smokeless tobacco: Never Used  Vaping Use  . Vaping Use: Never used  Substance and Sexual Activity  . Alcohol use: No    Comment: seldom  . Drug use: No  . Sexual activity: Never  Other Topics Concern  .  Not on file  Social History Narrative   Works for [employer]. Smoke 1p- 1-2 months; ocassional alcohol. Lives in [place]; mother lives in her.    Social Determinants of Health   Financial Resource Strain:   .  Difficulty of Paying Living Expenses:   Food Insecurity:   . Worried About Charity fundraiser in the Last Year:   . Arboriculturist in the Last Year:   Transportation Needs:   . Film/video editor (Medical):   Marland Kitchen Lack of Transportation (Non-Medical):   Physical Activity:   . Days of Exercise per Week:   . Minutes of Exercise per Session:   Stress:   . Feeling of Stress :   Social Connections:   . Frequency of Communication with Friends and Family:   . Frequency of Social Gatherings with Friends and Family:   . Attends Religious Services:   . Active Member of Clubs or Organizations:   . Attends Archivist Meetings:   Marland Kitchen Marital Status:   Intimate Partner Violence:   . Fear of Current or Ex-Partner:   . Emotionally Abused:   Marland Kitchen Physically Abused:   . Sexually Abused:     FAMILY HISTORY: Family History  Problem Relation Age of Onset  . Dementia Father   . Alcohol abuse Neg Hx   . Arthritis Neg Hx   . Asthma Neg Hx   . Birth defects Neg Hx   . Cancer Neg Hx   . COPD Neg Hx   . Depression Neg Hx   . Diabetes Neg Hx   . Drug abuse Neg Hx   . Early death Neg Hx   . Hearing loss Neg Hx   . Heart disease Neg Hx   . Hyperlipidemia Neg Hx   . Hypertension Neg Hx   . Kidney disease Neg Hx   . Learning disabilities Neg Hx   . Mental illness Neg Hx   . Mental retardation Neg Hx   . Miscarriages / Stillbirths Neg Hx   . Stroke Neg Hx   . Vision loss Neg Hx   . Varicose Veins Neg Hx     ALLERGIES:  is allergic to hydrocodone-acetaminophen, raspberry, and valium [diazepam].  MEDICATIONS:  Current Outpatient Medications  Medication Sig Dispense Refill  . butalbital-acetaminophen-caffeine (FIORICET) 50-325-40 MG tablet Take 1-2 tablets by mouth every 8 (eight) hours as needed for headache. 45 tablet 0  . diphenhydrAMINE (BENADRYL) 25 mg capsule Take 2 capsules (50 mg total) by mouth every 6 (six) hours as needed. 60 capsule 0  . ibuprofen (ADVIL,MOTRIN) 200 MG  tablet Take 400 mg by mouth every 8 (eight) hours as needed (for pain.).    Marland Kitchen metoCLOPramide (REGLAN) 10 MG tablet Take 1 tablet (10 mg total) by mouth every 6 (six) hours as needed. 30 tablet 0  . naproxen (NAPROSYN) 500 MG tablet Take 1 tablet (500 mg total) by mouth 2 (two) times daily with a meal. 20 tablet 0  . prednisoLONE acetate (PRED FORTE) 1 % ophthalmic suspension Place 1 drop into both eyes at bedtime.     No current facility-administered medications for this visit.      Marland Kitchen  PHYSICAL EXAMINATION: ECOG PERFORMANCE STATUS: 0 - Asymptomatic  Vitals:   12/29/19 0834  BP: 127/65  Pulse: 72  Resp: 20  Temp: (!) 97.2 F (36.2 C)   Filed Weights   12/29/19 0834  Weight: 227 lb (103 kg)    Physical Exam HENT:  Head: Normocephalic and atraumatic.     Mouth/Throat:     Pharynx: No oropharyngeal exudate.  Eyes:     Pupils: Pupils are equal, round, and reactive to light.  Neck:     Comments: Bilateral thyroid enlargement.  No tenderness. Cardiovascular:     Rate and Rhythm: Normal rate and regular rhythm.  Pulmonary:     Effort: No respiratory distress.     Breath sounds: No wheezing.  Abdominal:     General: Bowel sounds are normal. There is no distension.     Palpations: Abdomen is soft. There is no mass.     Tenderness: There is no abdominal tenderness. There is no guarding or rebound.  Musculoskeletal:        General: No tenderness. Normal range of motion.     Cervical back: Normal range of motion and neck supple.  Skin:    General: Skin is warm.  Neurological:     Mental Status: She is alert and oriented to person, place, and time.  Psychiatric:        Mood and Affect: Affect normal.      LABORATORY DATA:  I have reviewed the data as listed Lab Results  Component Value Date   WBC 6.0 12/11/2019   HGB 12.3 12/11/2019   HCT 37.8 12/11/2019   MCV 93.3 12/11/2019   PLT 210 12/11/2019   Recent Labs    11/24/19 1116 12/11/19 0707  NA 139 139  K  4.0 4.2  CL 110 106  CO2 25 25  GLUCOSE 98 105*  BUN 19 16  CREATININE 0.72 0.68  CALCIUM 9.2 9.6  GFRNONAA >60 >60  GFRAA >60 >60  PROT 7.7 8.2*  ALBUMIN 3.8 4.2  AST 17 17  ALT 16 17  ALKPHOS 62 58  BILITOT 0.6 0.8    RADIOGRAPHIC STUDIES: I have personally reviewed the radiological images as listed and agreed with the findings in the report. CT Angio Head W or Wo Contrast  Result Date: 12/11/2019 CLINICAL DATA:  Acute headache.  Normal neuro exam. EXAM: CT ANGIOGRAPHY HEAD AND NECK TECHNIQUE: Multidetector CT imaging of the head and neck was performed using the standard protocol during bolus administration of intravenous contrast. Multiplanar CT image reconstructions and MIPs were obtained to evaluate the vascular anatomy. Carotid stenosis measurements (when applicable) are obtained utilizing NASCET criteria, using the distal internal carotid diameter as the denominator. CONTRAST:  62mL OMNIPAQUE IOHEXOL 350 MG/ML SOLN COMPARISON:  CT head 12/11/2019 FINDINGS: CTA NECK FINDINGS Aortic arch: Bovine branching. Aortic arch and proximal great vessels normal. Right carotid system: Normal right carotid without atherosclerotic disease or dissection. Left carotid system: Normal left carotid without atherosclerotic disease or dissection. Vertebral arteries: Both vertebral arteries are patent to the basilar without stenosis. Skeleton: Negative Other neck: Diffuse thyroid enlargement bilaterally. 2.2 cm left lower pole nodule. Multiple additional subcentimeter nodules bilaterally. No adenopathy in the neck. Upper chest: Prominent soft tissue density in the anterior mediastinum which may represent enlarged thymus. This is incompletely evaluated. Lung apices clear bilaterally. Review of the MIP images confirms the above findings CTA HEAD FINDINGS Anterior circulation: Cavernous carotid normal bilaterally. No stenosis or aneurysm. Anterior and middle cerebral arteries widely patent bilaterally without  stenosis or aneurysm. Posterior circulation: Both vertebral arteries patent to the basilar. PICA patent bilaterally. Basilar widely patent. Superior cerebellar and posterior cerebral arteries patent bilaterally without stenosis or aneurysm. Venous sinuses: Normal venous enhancement Anatomic variants: None Review of the MIP images confirms the above findings  IMPRESSION: 1. Negative CTA head neck.  No arterial stenosis or obstruction. 2. Negative for cerebral aneurysm or vascular malformation 3. Increased soft tissue in the anterior mediastinum compatible with enlarged thymus. 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 Recommend CT chest with contrast for further evaluation. 4. Thyroid enlargement and bilateral thyroid nodules compatible with goiter. 2.2 cm left thyroid nodule. Thyroid ultrasound recommended. (Ref: J Am Coll Radiol. 2015 Feb;12(2): 143-50). Electronically Signed   By: Franchot Gallo M.D.   On: 12/11/2019 08:40   CT Head Wo Contrast  Result Date: 12/11/2019 CLINICAL DATA:  Cerebral imaged suspected. Headache beginning last week. EXAM: CT HEAD WITHOUT CONTRAST TECHNIQUE: Contiguous axial images were obtained from the base of the skull through the vertex without intravenous contrast. COMPARISON:  None. FINDINGS: Brain: No subdural, epidural, or subarachnoid hemorrhage identified. Cerebellum, brainstem, and basal cisterns are normal. Ventricles and sulci are normal. No mass effect or midline shift. No cortical ischemia or infarct. Vascular: No hyperdense vessel or unexpected calcification. Skull: Normal. Negative for fracture or focal lesion. Sinuses/Orbits: No acute finding. Other: None. IMPRESSION: 1. No acute intracranial abnormality to explain the patient's headache. No hemorrhage noted. Electronically Signed   By: Dorise Bullion III M.D   On: 12/11/2019 04:16   CT Angio Neck W and/or Wo Contrast  Result Date: 12/11/2019 CLINICAL DATA:  Acute headache.  Normal neuro exam. EXAM: CT ANGIOGRAPHY HEAD AND NECK  TECHNIQUE: Multidetector CT imaging of the head and neck was performed using the standard protocol during bolus administration of intravenous contrast. Multiplanar CT image reconstructions and MIPs were obtained to evaluate the vascular anatomy. Carotid stenosis measurements (when applicable) are obtained utilizing NASCET criteria, using the distal internal carotid diameter as the denominator. CONTRAST:  79mL OMNIPAQUE IOHEXOL 350 MG/ML SOLN COMPARISON:  CT head 12/11/2019 FINDINGS: CTA NECK FINDINGS Aortic arch: Bovine branching. Aortic arch and proximal great vessels normal. Right carotid system: Normal right carotid without atherosclerotic disease or dissection. Left carotid system: Normal left carotid without atherosclerotic disease or dissection. Vertebral arteries: Both vertebral arteries are patent to the basilar without stenosis. Skeleton: Negative Other neck: Diffuse thyroid enlargement bilaterally. 2.2 cm left lower pole nodule. Multiple additional subcentimeter nodules bilaterally. No adenopathy in the neck. Upper chest: Prominent soft tissue density in the anterior mediastinum which may represent enlarged thymus. This is incompletely evaluated. Lung apices clear bilaterally. Review of the MIP images confirms the above findings CTA HEAD FINDINGS Anterior circulation: Cavernous carotid normal bilaterally. No stenosis or aneurysm. Anterior and middle cerebral arteries widely patent bilaterally without stenosis or aneurysm. Posterior circulation: Both vertebral arteries patent to the basilar. PICA patent bilaterally. Basilar widely patent. Superior cerebellar and posterior cerebral arteries patent bilaterally without stenosis or aneurysm. Venous sinuses: Normal venous enhancement Anatomic variants: None Review of the MIP images confirms the above findings IMPRESSION: 1. Negative CTA head neck.  No arterial stenosis or obstruction. 2. Negative for cerebral aneurysm or vascular malformation 3. Increased soft  tissue in the anterior mediastinum compatible with enlarged thymus. 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 Recommend CT chest with contrast for further evaluation. 4. Thyroid enlargement and bilateral thyroid nodules compatible with goiter. 2.2 cm left thyroid nodule. Thyroid ultrasound recommended. (Ref: J Am Coll Radiol. 2015 Feb;12(2): 143-50). Electronically Signed   By: Franchot Gallo M.D.   On: 12/11/2019 08:40   CT Chest W Contrast  Result Date: 12/27/2019 CLINICAL DATA:  Possible thymic mass EXAM: CT CHEST WITH CONTRAST TECHNIQUE: Multidetector CT imaging of the chest was performed during intravenous contrast administration. CONTRAST:  68mL OMNIPAQUE IOHEXOL 300 MG/ML  SOLN COMPARISON:  CT head and neck angiogram, 12/11/2019, CT abdomen, 09/10/2017, 04/03/2017 FINDINGS: Cardiovascular: No significant vascular findings. Normal heart size. No pericardial effusion. Mediastinum/Nodes: No enlarged mediastinal, hilar, or axillary lymph nodes. There is soft tissue in the anterior mediastinum measuring approximately 8.2 x 6.0 x 3.9 cm and generally in a triangular contour within the borders of the mediastinum (series 2, image 55). Redemonstrated enlarged, heterogeneous multinodular thyroid. Trachea and esophagus demonstrate no significant findings. Lungs/Pleura: Minimal dependent bibasilar atelectasis. No pleural effusion or pneumothorax. Upper Abdomen: No acute abnormality. Hepatic steatosis. Enhancing lesions of the anterior and posterior liver dome, unchanged compared to prior CT dated 09/10/2017 in exhibiting some evidence of peripheral nodular enhancement (series 2, image 95, 93). Musculoskeletal: No chest wall mass or suspicious bone lesions identified. IMPRESSION: 1. There is soft tissue in the anterior mediastinum measuring approximately 8.2 x 6.0 x 3.9 cm, generally in a triangular contour within the borders of the mediastinum. Favor thymic hyperplasia however thymoma is difficult to exclude. Chemical shift MRI and  tissue sampling may be considered for further evaluation. 2. Redemonstrated enlarged, heterogeneous multinodular thyroid. Please see forthcoming thyroid ultrasound. Of note, clinical hyperthyroidism is sometimes associated with thymic hyperplasia. 3. Enhancing lesions of the anterior and posterior liver dome, unchanged compared to prior CT examinations dating back to 04/03/2017 and exhibiting some evidence of peripheral nodular enhancement, almost certainly benign hemangiomata. 4. Hepatic steatosis. Electronically Signed   By: Eddie Candle M.D.   On: 12/27/2019 16:51   US THYROID  Result Date: 12/28/2019 CLINICAL DATA:  Incidental on CT. Thyroid nodules incidentally noted on chest CT performed 12/27/2019 and neck CT performed 12/11/2019 EXAM: THYROID ULTRASOUND TECHNIQUE: Ultrasound examination of the thyroid gland and adjacent soft tissues was performed. COMPARISON:  Chest CT-12/27/2019; neck CT-12/11/2019 FINDINGS: Parenchymal Echotexture: Moderately heterogenous Isthmus: Borderline enlarged measures 0.9 cm in diameter Right lobe: Enlarged measuring 5.9 x 3.5 x 2.8 cm Left lobe: Enlarged measuring 6.4 x 3.2 x 3.2 cm. _________________________________________________________ Estimated total number of nodules >/= 1 cm: 3 Number of spongiform nodules >/=  2 cm not described below (TR1): 0 Number of mixed cystic and solid nodules >/= 1.5 cm not described below (TR2): 0 _________________________________________________________ Nodule # 1: Location: Right; Mid Maximum size: 1.7 cm; Other 2 dimensions: 1.5 x 1.1 cm Composition: solid/almost completely solid (2) Echogenicity: isoechoic (1) Shape: not taller-than-wide (0) Margins: ill-defined (0) Echogenic foci: none (0) ACR TI-RADS total points: 3. ACR TI-RADS risk category: TR3 (3 points). ACR TI-RADS recommendations: *Given size (>/= 1.5 - 2.4 cm) and appearance, a follow-up ultrasound in 1 year should be considered based on TI-RADS criteria.  _________________________________________________________ Nodule # 2: Location: Right; Mid Maximum size: 1.6 cm; Other 2 dimensions: 1.3 x 1.2 cm Composition: solid/almost completely solid (2) Echogenicity: isoechoic (1) Shape: not taller-than-wide (0) Margins: ill-defined (0) Echogenic foci: none (0) ACR TI-RADS total points: 3. ACR TI-RADS risk category: TR3 (3 points). ACR TI-RADS recommendations: *Given size (>/= 1.5 - 2.4 cm) and appearance, a follow-up ultrasound in 1 year should be considered based on TI-RADS criteria. _________________________________________________________ Nodule # 3: Location: Right; Inferior Maximum size: 0.9 cm; Other 2 dimensions: 0.9 x 0.8 cm Composition: solid/almost completely solid (2) Echogenicity: hypoechoic (2) Shape: not taller-than-wide (0) Margins: smooth (0) Echogenic foci: none (0) ACR TI-RADS total points: 4. ACR TI-RADS risk category: TR4 (4-6 points). ACR TI-RADS recommendations: Given size (<0.9 cm) and appearance, this nodule does NOT meet TI-RADS criteria for biopsy or dedicated  follow-up. _________________________________________________________ IMPRESSION: 1. Thyromegaly with findings suggestive of multinodular goiter. 2. Nodules #1 and #2 both meet imaging criteria to recommend a 1 year follow-up as indicated. The above is in keeping with the ACR TI-RADS recommendations - J Am Coll Radiol 2017;14:587-595. Electronically Signed   By: Sandi Mariscal M.D.   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  CT chest shows enlarged thymus-8 x 6 x 4 cm anterior mediastinal mass-thymic hyperplasia versus thymoma.  Discussed regarding possible need for biopsy versus follow-up surveillance imaging.  Will review at tumor conference.   #Thyroid nodules-/euthyroid -thyroid profile normal MNG-reviewed ultrasound no plan for any biopsy at this time.  Will need repeat ultrasound again in 1 year.  # Headaches- ?  Migraines-stress related.  Improved.  # DISPOSITION: # follow up TBD--Dr.B  # I reviewed the blood work- with the patient in detail; also reviewed the imaging independently [as summarized above]; and with the patient in detail.     All questions were answered. The patient knows to call the clinic with any problems, questions or concerns.     Cammie Sickle, MD 12/29/2019 10:20 AM

## 2019-12-29 NOTE — Assessment & Plan Note (Addendum)
#  Thymic mass noted-incidentally on CT neck.  CT chest shows enlarged thymus-8 x 6 x 4 cm anterior mediastinal mass-thymic hyperplasia versus thymoma.  Discussed regarding possible need for biopsy versus follow-up surveillance imaging.  Will review at tumor conference.   #Thyroid nodules-/euthyroid -thyroid profile normal MNG-reviewed ultrasound no plan for any biopsy at this time.  Will need repeat ultrasound again in 1 year.  # Headaches- ? Migraines-stress related.  Improved.  # DISPOSITION: # follow up TBD--Dr.B  # I reviewed the blood work- with the patient in detail; also reviewed the imaging independently [as summarized above]; and with the patient in detail.

## 2020-01-05 ENCOUNTER — Other Ambulatory Visit: Payer: PRIVATE HEALTH INSURANCE

## 2020-01-05 ENCOUNTER — Telehealth: Payer: Self-pay | Admitting: Internal Medicine

## 2020-01-05 DIAGNOSIS — E329 Disease of thymus, unspecified: Secondary | ICD-10-CM

## 2020-01-05 NOTE — Telephone Encounter (Signed)
On 7/22-spoke to patient regarding discussion tumor conference-thymic hyperplasia versus thymoma.  Recommend evaluation with Dr. Faith Rogue.  Please make a referral to Dr. Faith Rogue.Dx; thymic mass  [place]-please follow-up regarding referral to Dr. Genevive Bi.

## 2020-01-06 NOTE — Telephone Encounter (Signed)
Referral ordered

## 2020-01-06 NOTE — Addendum Note (Signed)
Addended by: Delice Bison E on: 01/06/2020 08:31 AM   Modules accepted: Orders

## 2020-01-06 NOTE — Progress Notes (Signed)
Tumor Board Documentation  Dana West was presented by Dr Rogue Bussing at our Tumor Board on 01/05/2020, which included representatives from medical oncology, radiation oncology, internal medicine, navigation, pathology, radiology, surgical, pharmacy, genetics, palliative care, pulmonology.  Dana West currently presents as a new patient, for discussion with history of the following treatments: active survellience.  Additionally, we reviewed previous medical and familial history, history of present illness, and recent lab results along with all available histopathologic and imaging studies. The tumor board considered available treatment options and made the following recommendations: Biopsy (Surgical vs CTguided)    The following procedures/referrals were also placed: No orders of the defined types were placed in this encounter.   Clinical Trial Status: not discussed   Staging used: Not Applicable  National site-specific guidelines   were discussed with respect to the case.  Tumor board is a meeting of clinicians from various specialty areas who evaluate and discuss patients for whom a multidisciplinary approach is being considered. Final determinations in the plan of care are those of the provider(s). The responsibility for follow up of recommendations given during tumor board is that of the provider.   Today's extended care, comprehensive team conference, Dana West was not present for the discussion and was not examined.   Multidisciplinary Tumor Board is a multidisciplinary case peer review process.  Decisions discussed in the Multidisciplinary Tumor Board reflect the opinions of the specialists present at the conference without having examined the patient.  Ultimately, treatment and diagnostic decisions rest with the primary provider(s) and the patient.

## 2020-01-13 ENCOUNTER — Telehealth: Payer: Self-pay

## 2020-01-13 ENCOUNTER — Other Ambulatory Visit: Payer: Self-pay

## 2020-01-13 ENCOUNTER — Ambulatory Visit (INDEPENDENT_AMBULATORY_CARE_PROVIDER_SITE_OTHER): Payer: PRIVATE HEALTH INSURANCE | Admitting: Cardiothoracic Surgery

## 2020-01-13 ENCOUNTER — Encounter: Payer: Self-pay | Admitting: Cardiothoracic Surgery

## 2020-01-13 VITALS — BP 118/56 | HR 73 | Temp 97.7°F | Resp 12 | Ht 67.0 in | Wt 226.0 lb

## 2020-01-13 DIAGNOSIS — J9859 Other diseases of mediastinum, not elsewhere classified: Secondary | ICD-10-CM

## 2020-01-13 NOTE — Telephone Encounter (Signed)
Spoke with Dr.Oaks- Dr.Oaks to call [facility] and speak with Dr.Bibby [phone] regarding MRI that is recomended.      Spoke with Raven at [facility]- order needs to be faxed and they will call patient to schedule MRI.   Patient notified of [facility] will contact her to schedule chemical Shift MRI ( Spectroscopy) . Order faxed to [facility] with patient demographics.  Faxed to [phone].  Patient will call office to schedule a follow up appointment with Dr.oaks once MRI is scheduled.

## 2020-01-13 NOTE — Patient Instructions (Addendum)
Please call our office if you have questions or concerns. I will call to schedule the MRI and let you know later today or Monday the appointment information.   [place]  [address] [place] [address] [phone]  Please see your appointment listed below with Dr.Cannon.

## 2020-01-13 NOTE — Progress Notes (Signed)
Patient ID: Dana West, female   DOB: 1975/11/22, 44 y.o.   MRN: [id number]  Chief Complaint  Patient presents with  . New Patient (Initial Visit)    Thymus mass    Referred By Dr. Rogue Bussing Reason for Referral pain. Anterior mediastinal mass  HPI Location, Quality, Duration, Severity, Timing, Context, Modifying Factors, Associated Signs and Symptoms.  Dana West is a 44 y.o. female. Several weeks ago she experienced the acute onset of right-sided pain from her head down into her neck. Pain was so severe that after a day or 2 she sought medical attention. She came to the emergency department where a neck and chest CT were performed. 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 Patient was then seen by Dr. Lynett Fish. The patient had a thyroid ultrasound which revealed a suspicious appearing lesions and is sent here for evaluation of her anterior mediastinal mass. She continues to have pain that radiates up into her neck and to the right side of her head. This occurs intermittently and occurs 1-2 times per day and can last several hours. It is occasionally associated with some discomfort into her neck and upper chest. She states that with the severe pain she has to stop doing the things she is doing because it causes such difficulty to concentrate. She denies any prior chest or neck surgery. She a non-smoker.   Past Medical History:  Diagnosis Date  . Abnormal uterine bleeding 01/12/2015  . Anemia   . Bowel perforation ([place])   . Diverticulitis of large intestine with perforation and abscess 04/04/2017  . 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 Intramural leiomyoma of uterus 01/11/2015  . Menometrorrhagia 04/18/2014  . Obesity (BMI 35.0-39.9 without  comorbidity)   . Perforated diverticulum of large intestine   . Submucous leiomyoma of uterus 04/18/2014  . Subserous leiomyoma of uterus 03/16/2017    Past Surgical History:  Procedure Laterality Date  . ABDOMINAL HYSTERECTOMY  2017  . COLECTOMY WITH COLOSTOMY CREATION/HARTMANN PROCEDURE N/A 04/03/2017   Procedure: SIGMOID COLECTOMY WITH COLOSTOMY CREATION/HARTMANN PROCEDURE;  Surgeon: Vickie Epley, MD;  Location: [facility] ORS;  Service: General;  Laterality: N/A;  . COLONOSCOPY WITH PROPOFOL N/A 07/21/2017   Procedure: COLONOSCOPY WITH PROPOFOL;  Surgeon: Lin Landsman, MD;  Location: [facility]COPY;  Service: Gastroenterology;  Laterality: N/A;  . COLOSTOMY REVERSAL N/A 08/20/2017   Procedure: COLOSTOMY REVERSAL;  Surgeon: Vickie Epley, MD;  Location: [facility] ORS;  Service: General;  Laterality: N/A;  . UTERINE FIBROID SURGERY      Family History  Problem Relation Age of Onset  . Dementia Father   . Alcohol abuse Neg Hx   . Arthritis Neg Hx   . Asthma Neg Hx   . Birth defects Neg Hx   . Cancer Neg Hx   . COPD Neg Hx   . Depression Neg Hx   . Diabetes Neg Hx   . Drug abuse Neg Hx   . Early death Neg Hx   . Hearing loss Neg Hx   . Heart disease Neg Hx   . Hyperlipidemia Neg Hx   . Hypertension Neg Hx   . Kidney disease Neg Hx   . Learning disabilities Neg Hx   . Mental illness Neg Hx   .  Mental retardation Neg Hx   . Miscarriages / Stillbirths Neg Hx   . Stroke Neg Hx   . Vision loss Neg Hx   . Varicose Veins Neg Hx     Social History Social History   Tobacco Use  . Smoking status: Former Smoker    Quit date: 07/13/2017    Years since quitting: 2.5  . Smokeless tobacco: Never Used  Vaping Use  . Vaping Use: Never used  Substance Use Topics  . Alcohol use: No    Comment: seldom  . Drug use: No    Allergies  Allergen Reactions  . Hydrocodone-Acetaminophen Hives  . Raspberry Nausea And Vomiting and Swelling  . Valium [Diazepam] Hives    Current  Outpatient Medications  Medication Sig Dispense Refill  . butalbital-acetaminophen-caffeine (FIORICET) 50-325-40 MG tablet Take 1-2 tablets by mouth every 8 (eight) hours as needed for headache. 45 tablet 0  . diphenhydrAMINE (BENADRYL) 25 mg capsule Take 2 capsules (50 mg total) by mouth every 6 (six) hours as needed. 60 capsule 0  . ibuprofen (ADVIL,MOTRIN) 200 MG tablet Take 400 mg by mouth every 8 (eight) hours as needed (for pain.).    Marland Kitchen metoCLOPramide (REGLAN) 10 MG tablet Take 1 tablet (10 mg total) by mouth every 6 (six) hours as needed. 30 tablet 0  . naproxen (NAPROSYN) 500 MG tablet Take 1 tablet (500 mg total) by mouth 2 (two) times daily with a meal. 20 tablet 0  . prednisoLONE acetate (PRED FORTE) 1 % ophthalmic suspension Place 1 drop into both eyes at bedtime.     No current facility-administered medications for this visit.      Review of Systems A complete review of systems was asked and was negative except for the following positive findings none  Blood pressure (!) 118/56, pulse 73, temperature 97.7 F (36.5 C), temperature source Oral, resp. rate 12, height 5\' 7"  (1.702 m), weight (!) 226 lb (102.5 kg), last menstrual period 01/28/2015, SpO2 96 %.  Physical Exam CONSTITUTIONAL:  Pleasant, well-developed, well-nourished, and in no acute distress. EYES: Pupils equal and reactive to light, Sclera non-icteric EARS, NOSE, MOUTH AND THROAT:  The oropharynx was clear.  Dentition is good repair.  Oral mucosa pink and moist. LYMPH NODES:  Lymph nodes in the neck and axillae were normal RESPIRATORY:  Lungs were clear.  Normal respiratory effort without pathologic use of accessory muscles of respiration CARDIOVASCULAR: Heart was regular without murmurs.  There were no carotid bruits. GI: The abdomen was soft, nontender, and nondistended. There were no palpable masses. There was no hepatosplenomegaly. There were normal bowel sounds in all quadrants. GU:  Rectal deferred.    MUSCULOSKELETAL:  Normal muscle strength and tone.  No clubbing or cyanosis.   SKIN:  There were no pathologic skin lesions.  There were no nodules on palpation. NEUROLOGIC:  Sensation is normal.  Cranial nerves are grossly intact. PSYCH:  Oriented to person, place and time.  Mood and affect are normal.  Data Reviewed CT scans  I have personally reviewed the patient's imaging, laboratory findings and medical records.    Assessment    I have independently reviewed her chest CT. 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 I see no evidence of thymoma or lymphoma.    Plan    I do think that we should get an MRI of her chest to rule out thymic hyperplasia. I have also asked her to see Dr. Fredirick Maudlin  in our office regarding her multinodular goiter. She will come back to see me in 2 weeks       Nestor Lewandowsky, MD 01/13/2020, 10:32 AM

## 2020-01-16 ENCOUNTER — Telehealth: Payer: Self-pay

## 2020-01-16 NOTE — Telephone Encounter (Signed)
Per Dr.Oaks cancel MRI and schedule CT chest with contrast in 3 months-September 2021 and follow up after scan. Patient notified. Placed in recall box.

## 2020-01-18 IMAGING — RF DG BE LIMITED  W/ CM
8 series · 9 of 9 positions shown · IV contrast (agent unspecified)
Comparison: Abdominal and pelvic CT scan dated April 03, 2017.

CLINICAL DATA: Evaluation for colostomy reversal. History of
diverticulitis with colostomy creation in March 2017

EXAM:
BE LIMITED WITH CONTRAST
CONTRAST:  Thin barium.
FLUOROSCOPY TIME:  Fluoroscopy Time:  1 minute, 12 seconds
Radiation Exposure Index (if provided by the fluoroscopic device):
8772 micro Gy per meter squared
Number of Acquired Spot Images: 6

[Series 2: t abdomen supine · 0.15mm/px · 1 of 1 slices shown]
[im 1/1]
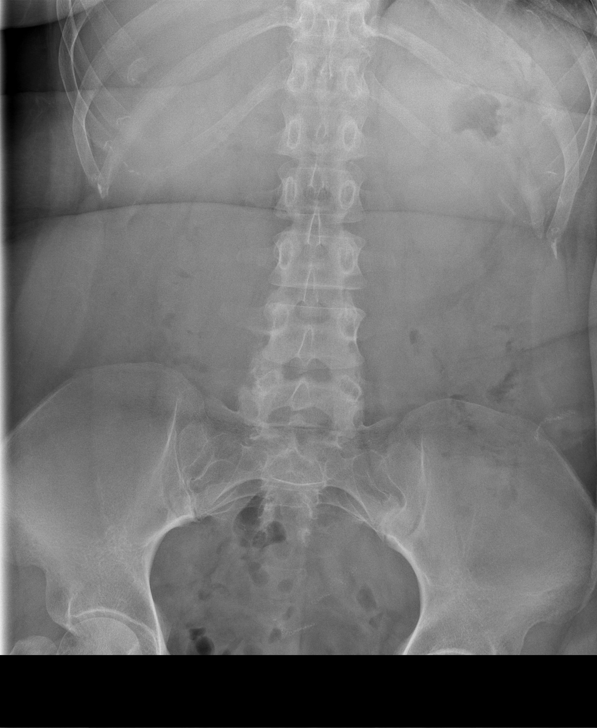

[Series 3: fluoro_barium singleshot_bw · 0.18mm/px · 1 of 1 slices shown (1 of 3)]
[im 1/1]
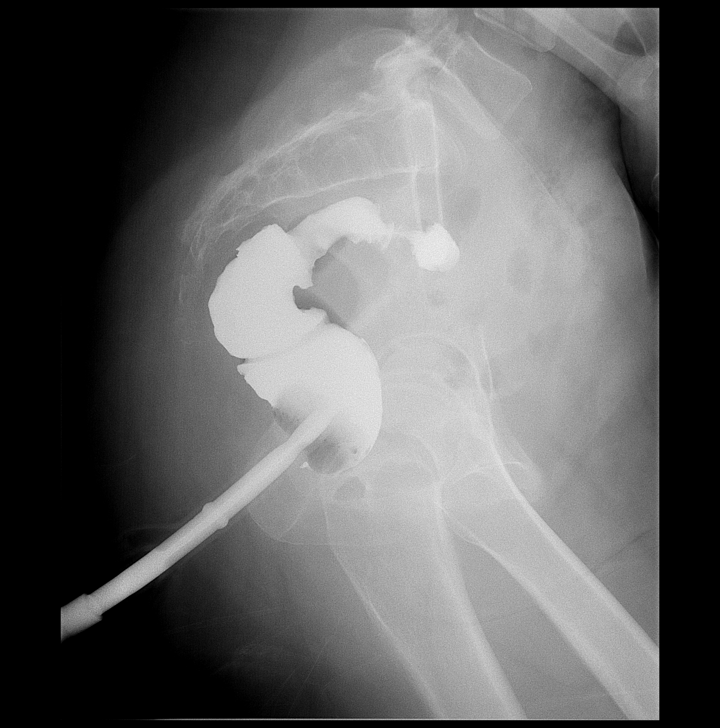

[Series 4: fluoro_barium singleshot_bw · 0.18mm/px · 1 of 1 slices shown (2 of 3)]
[im 1/1]
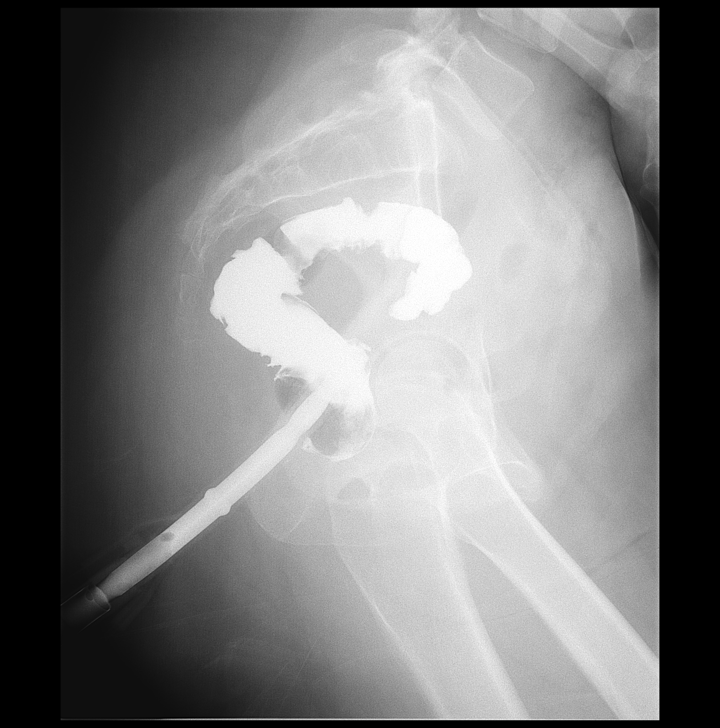

[Series 5: cp_standard · 0.28mm/px · 2 of 2 slices shown (1 of 2)]
[im 1/2]
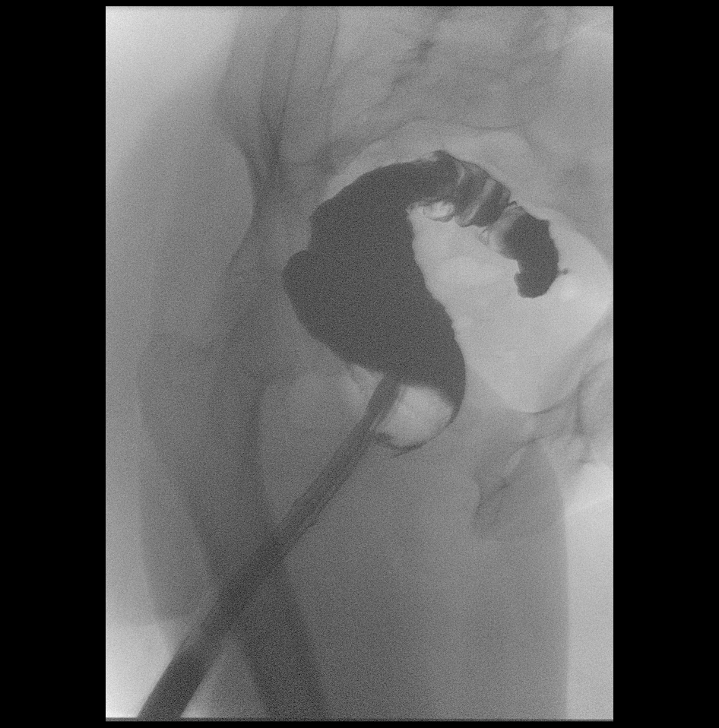
[im 2/2]
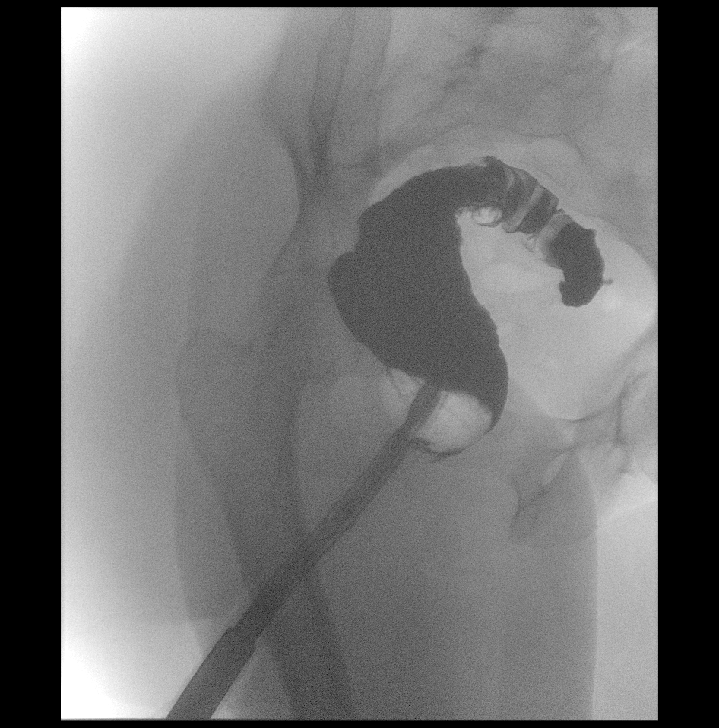

[Series 7: fluoro_barium singleshot_bw · 0.18mm/px · 1 of 1 slices shown (3 of 3)]
[im 1/1]
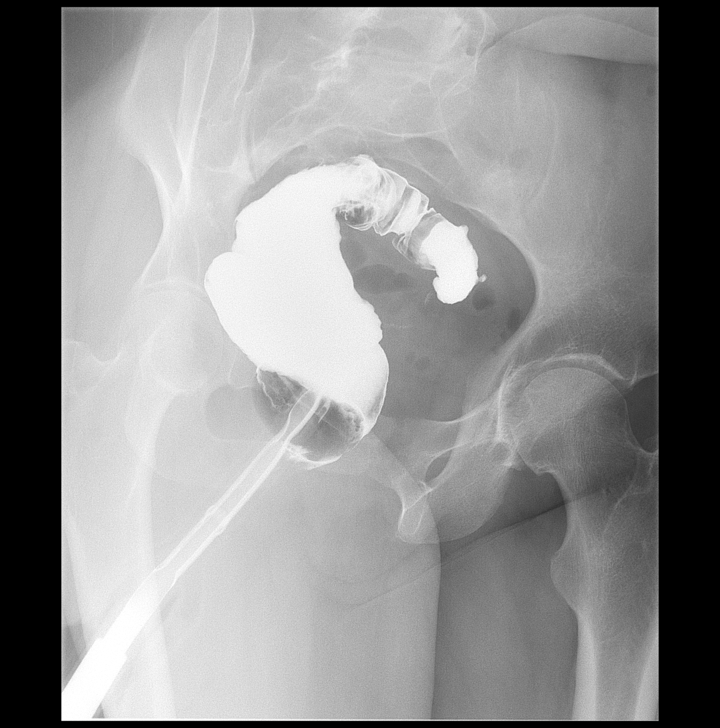

[Series 8: cp_standard · 0.28mm/px · 1 of 1 slices shown (2 of 2)]
[im 1/1]
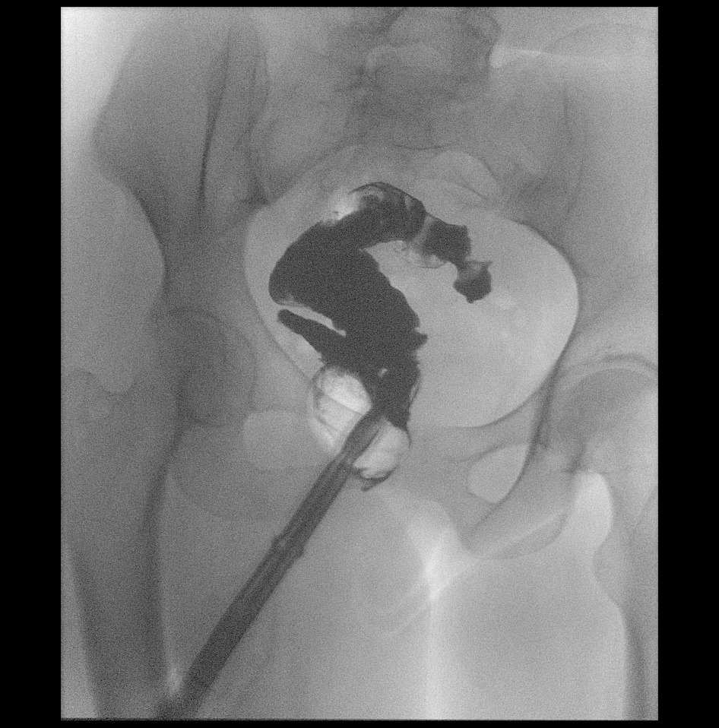

[Series 9: t abdomen barium ap · 0.15mm/px · 1 of 1 slices shown (1 of 2)]
[im 1/1]
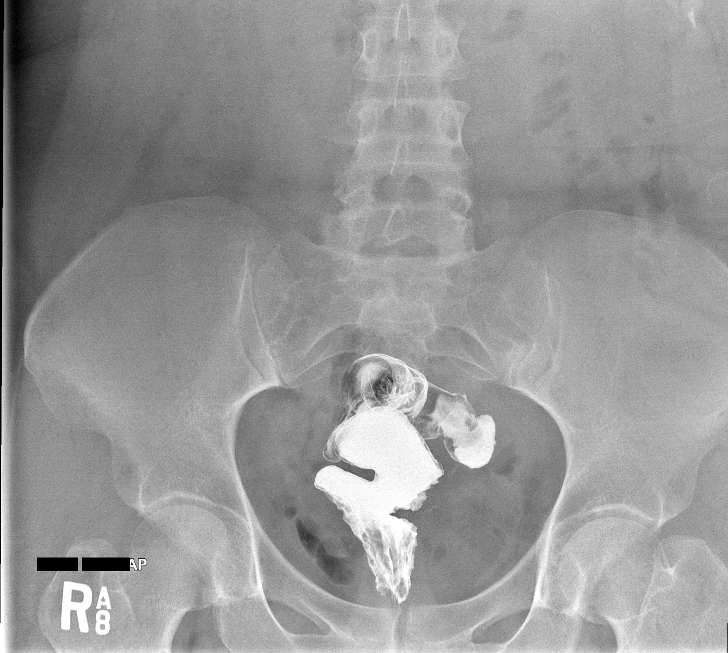

[Series 10: t abdomen barium ap · 0.15mm/px · 1 of 1 slices shown (2 of 2)]
[im 1/1]
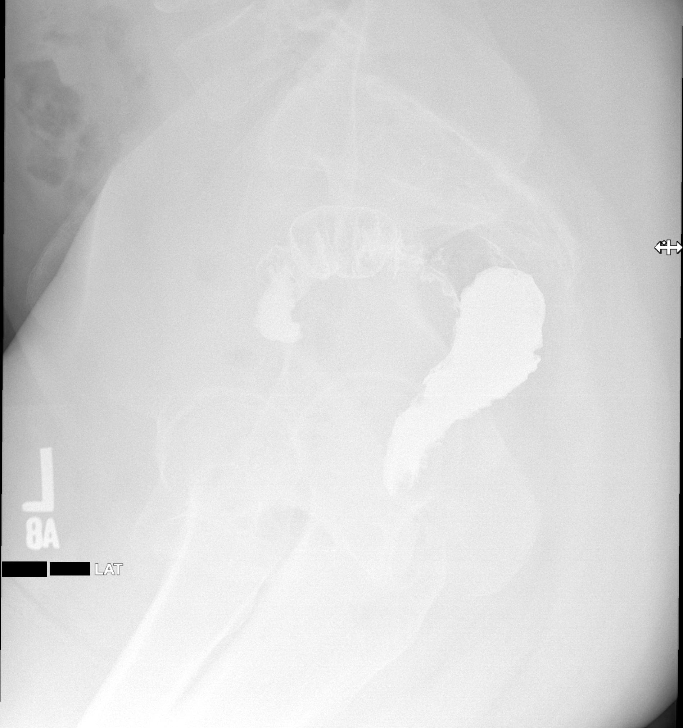

[9 of 9 positions shown; findings below may reference images not displayed]

FINDINGS: The scout radiograph reveals a normal bowel gas pattern. The
anticipated procedure was discussed with the patient. She voiced her
willingness to proceed. The barium catheter balloon was inflated
under fluoroscopic guidance. Barium was then instilled under
fluoroscopic observation. The barium passed retrograde to the level
of the distal sigmoid but passed no farther. A suture line was
visible here. A single diverticulum was observed. There was no
evidence of leakage or acute diverticulitis. The patient had
moderate discomfort during the procedure. The barium was then
drained and the patient allowed to proceed to the restroom to
evacuate the remainder of the contrast. The post evacuation images
revealed a small amount of retained contrast but no evidence of
leakage.
IMPRESSION: No evidence of active diverticulitis or of perforation of the rectum
or distal sigmoid.

## 2020-01-24 ENCOUNTER — Ambulatory Visit (INDEPENDENT_AMBULATORY_CARE_PROVIDER_SITE_OTHER): Payer: PRIVATE HEALTH INSURANCE | Admitting: General Surgery

## 2020-01-24 ENCOUNTER — Encounter: Payer: Self-pay | Admitting: General Surgery

## 2020-01-24 ENCOUNTER — Other Ambulatory Visit: Payer: Self-pay

## 2020-01-24 VITALS — BP 113/78 | HR 67 | Temp 98.0°F | Ht 67.0 in | Wt 225.2 lb

## 2020-01-24 DIAGNOSIS — E041 Nontoxic single thyroid nodule: Secondary | ICD-10-CM

## 2020-01-24 NOTE — Patient Instructions (Addendum)
Dr.Cannon suggests patient to make an appointment to follow up with a dentist.  Patient will have an repeat Ultrasound in one year to follow up with Thyroid.  Dr.Cannon advised patient to contact our office if she notices symptoms to worsen.   Thyroid Nodule  A thyroid nodule is an isolated growth of thyroid cells that forms a lump in your thyroid gland. The thyroid gland is a butterfly-shaped gland. It is found in the lower front of your neck. This gland sends chemical messengers (hormones) through your blood to all parts of your body. These hormones are important in regulating your body temperature and helping your body to use energy. Thyroid nodules are common. Most are not cancerous (benign). You may have one nodule or several nodules. Different types of thyroid nodules include nodules that:  Grow and fill with fluid (thyroid cysts).  Produce too much thyroid hormone (hot nodules or hyperthyroid).  Produce no thyroid hormone (cold nodules or hypothyroid).  Form from cancer cells (thyroid cancers). What are the causes? In most cases, the cause of this condition is not known. What increases the risk? The following factors may make you more likely to develop this condition.  Age. Thyroid nodules become more common in people who are older than 46 years of age.  Gender. ? Benign thyroid nodules are more common in women. ? Cancerous (malignant) thyroid nodules are more common in men.  A family history that includes: ? Thyroid nodules. ? Pheochromocytoma. ? Thyroid carcinoma. ? Hyperparathyroidism.  Certain kinds of thyroid diseases, such as Hashimoto's thyroiditis.  Lack of iodine in your diet.  A history of head and neck radiation, such as from previous cancer treatment. What are the signs or symptoms? In many cases, there are no symptoms. If you have symptoms, they may include:  A lump in your lower neck.  Feeling a lump or tickle in your throat.  Pain in your neck, jaw,  or ear.  Having trouble swallowing. Hot nodules may cause symptoms that include:  Weight loss.  Warm, flushed skin.  Feeling hot.  Feeling nervous.  A racing heartbeat. Cold nodules may cause symptoms that include:  Weight gain.  Dry skin.  Brittle hair. This may also occur with hair loss.  Feeling cold.  Fatigue. Thyroid cancer nodules may cause symptoms that include:  Hard nodules that feel stuck to the thyroid gland.  Hoarseness.  Lumps in the glands near your thyroid (lymph nodes). How is this diagnosed? A thyroid nodule may be felt by your health care provider during a physical exam. This condition may also be diagnosed based on your symptoms. You may also have tests, including:  An ultrasound. This may be done to confirm the diagnosis.  A biopsy. This involves taking a sample from the nodule and looking at it under a microscope.  Blood tests to make sure that your thyroid is working properly.  A thyroid scan. This test uses a radioactive tracer injected into a vein to create an image of the thyroid gland on a computer screen.  Imaging tests such as MRI or CT scan. These may be done if: ? Your nodule is large. ? Your nodule is blocking your airway. ? Cancer is suspected. How is this treated? Treatment depends on the cause and size of your nodule or nodules. If the nodule is benign, treatment may not be necessary. Your health care provider may monitor the nodule to see if it goes away without treatment. If the nodule continues to grow, is  cancerous, or does not go away, treatment may be needed. Treatment may include:  Having a cystic nodule drained with a needle.  Ablation therapy. In this treatment, alcohol is injected into the area of the nodule to destroy the cells. Ablation with heat (thermal ablation) may also be used.  Radioactive iodine. In this treatment, radioactive iodine is given as a pill or liquid that you drink. This substance causes the thyroid  nodule to shrink.  Surgery to remove the nodule. Part or all of your thyroid gland may need to be removed as well.  Medicines. Follow these instructions at home:  Pay attention to any changes in your nodule.  Take over-the-counter and prescription medicines only as told by your health care provider.  Keep all follow-up visits as told by your health care provider. This is important. Contact a health care provider if:  Your voice changes.  You have trouble swallowing.  You have pain in your neck, ear, or jaw that is getting worse.  Your nodule gets bigger.  Your nodule starts to make it harder for you to breathe.  Your muscles look like they are shrinking (muscle wasting). Get help right away if:  You have chest pain.  There is a loss of consciousness.  You have a sudden fever.  You feel confused.  You are seeing or hearing things that other people do not see or hear (having hallucinations).  You feel very weak.  You have mood swings.  You feel very restless.  You feel suddenly nauseous or throw up.  You suddenly have diarrhea. Summary  A thyroid nodule is an isolated growth of thyroid cells that forms a lump in your thyroid gland.  Thyroid nodules are common. Most are not cancerous (benign). You may have one nodule or several nodules.  Treatment depends on the cause and size of your nodule or nodules. If the nodule is benign, treatment may not be necessary.  Your health care provider may monitor the nodule to see if it goes away without treatment. If the nodule continues to grow, is cancerous, or does not go away, treatment may be needed. This information is not intended to replace advice given to you by your health care provider. Make sure you discuss any questions you have with your health care provider. Document Revised: 01/15/2018 Document Reviewed: 01/18/2018 Elsevier Patient Education  [employer].

## 2020-01-24 NOTE — Progress Notes (Signed)
Patient ID: Dana West, female   DOB: 08-May-1976, 44 y.o.   MRN: [id number]  Chief Complaint  Patient presents with  . Follow-up    Dr Celine Ahr- Ref per Dr Genevive Bi Thyroid nodule    HPI Dana West is a 44 y.o. female.  She has been referred by Dr. Genevive Bi for further evaluation of a multinodular goiter.  Dana West presented to the emergency department on December 11, 2019 with severe pain on the right side of her face and head.  She underwent imaging including a CT angio of the head and neck that did not reveal any dissection or other vascular pathology.  The lower cuts of the imaging showed a possible mediastinal mass.  A chest CT demonstrated findings suggestive of possible thymic hyperplasia versus thymoma as well as a multinodular goiter.  She saw Dr. 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  She will continue to follow with him for surveillance.  The thyroid was further evaluated with ultrasound.  Multiple thyroid nodules were identified, none of which meet TI-RADS criteria for biopsy.  She is here today for further discussion of the thyroid findings.  She states that she has never had a thyroid biopsy; she was unaware that she had nodules prior to all of the imaging.  She denies difficulty swallowing but endorses pain on the right side of her face when chewing.  She does endorse frequent throat clearing and occasionally notices pressure in her neck while in a supine position.  She says that she has heart palpitations and feels anxious and wound up, but attributes this to extremely high stress job.  She denies any changes in her hair but does endorse dry her skin and yellowing of her nails.  She has occasional constipation and endorses fatigue.  She has deliberately lost weight.  No heat or cold intolerance.  No occupational or therapeutic exposure to ionizing radiation.  She does report that her maternal aunt had thyroid  problems, but is not certain what those were, specifically.  She reports that the initial symptoms that brought her to the emergency department have more or less subsided, but she feels a similar pain when she is at work or en route to work.   Past Medical History:  Diagnosis Date  . Abnormal uterine bleeding 01/12/2015  . Anemia   . Bowel perforation ([place])   . Diverticulitis of large intestine with perforation and abscess 04/04/2017  . 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 Intramural leiomyoma of uterus 01/11/2015  . Menometrorrhagia 04/18/2014  . Obesity (BMI 35.0-39.9 without comorbidity)   . Perforated diverticulum of large intestine   . Submucous leiomyoma of uterus 04/18/2014  . Subserous leiomyoma of uterus 03/16/2017    Past Surgical History:  Procedure Laterality Date  . ABDOMINAL HYSTERECTOMY  2017  . COLECTOMY WITH COLOSTOMY CREATION/HARTMANN PROCEDURE N/A 04/03/2017   Procedure: SIGMOID COLECTOMY WITH COLOSTOMY CREATION/HARTMANN PROCEDURE;  Surgeon: Vickie Epley, MD;  Location: [facility] ORS;  Service: General;  Laterality: N/A;  . COLONOSCOPY WITH PROPOFOL N/A 07/21/2017   Procedure: COLONOSCOPY WITH PROPOFOL;  Surgeon: Lin Landsman, MD;  Location: [facility];  Service: Gastroenterology;  Laterality: N/A;  . COLOSTOMY REVERSAL N/A 08/20/2017   Procedure: COLOSTOMY REVERSAL;  Surgeon: Vickie Epley, MD;  Location: [facility] ORS;  Service: General;  Laterality: N/A;  .  UTERINE FIBROID SURGERY      Family History  Problem Relation Age of Onset  . Dementia Father   . Alcohol abuse Neg Hx   . Arthritis Neg Hx   . Asthma Neg Hx   . Birth defects Neg Hx   . Cancer Neg Hx   . COPD Neg Hx   . Depression Neg Hx   . Diabetes Neg Hx   . Drug abuse Neg Hx   . Early death Neg Hx   . Hearing loss Neg Hx   . Heart disease Neg Hx   . Hyperlipidemia Neg Hx   . Hypertension Neg Hx    . Kidney disease Neg Hx   . Learning disabilities Neg Hx   . Mental illness Neg Hx   . Mental retardation Neg Hx   . Miscarriages / Stillbirths Neg Hx   . Stroke Neg Hx   . Vision loss Neg Hx   . Varicose Veins Neg Hx     Social History Social History   Tobacco Use  . Smoking status: Former Smoker    Quit date: 07/13/2017    Years since quitting: 2.5  . Smokeless tobacco: Never Used  Vaping Use  . Vaping Use: Never used  Substance Use Topics  . Alcohol use: No    Comment: seldom  . Drug use: No    Allergies  Allergen Reactions  . Hydrocodone-Acetaminophen Hives  . Raspberry Nausea And Vomiting and Swelling  . Valium [Diazepam] Hives    Current Outpatient Medications  Medication Sig Dispense Refill  . butalbital-acetaminophen-caffeine (FIORICET) 50-325-40 MG tablet Take 1-2 tablets by mouth every 8 (eight) hours as needed for headache. 45 tablet 0  . diphenhydrAMINE (BENADRYL) 25 mg capsule Take 2 capsules (50 mg total) by mouth every 6 (six) hours as needed. 60 capsule 0  . ibuprofen (ADVIL,MOTRIN) 200 MG tablet Take 400 mg by mouth every 8 (eight) hours as needed (for pain.).    Marland Kitchen metoCLOPramide (REGLAN) 10 MG tablet Take 1 tablet (10 mg total) by mouth every 6 (six) hours as needed. 30 tablet 0  . naproxen (NAPROSYN) 500 MG tablet Take 1 tablet (500 mg total) by mouth 2 (two) times daily with a meal. 20 tablet 0  . prednisoLONE acetate (PRED FORTE) 1 % ophthalmic suspension Place 1 drop into both eyes at bedtime.     No current facility-administered medications for this visit.    Review of Systems Review of Systems  All other systems reviewed and are negative. Or as discussed in the history of present illness.  Blood pressure 113/78, pulse 67, temperature 98 F (36.7 C), temperature source Oral, height 5\' 7"  (1.702 m), weight 225 lb 3.2 oz (102.2 kg), last menstrual period 01/28/2015, SpO2 93 %. Body mass index is 35.27 kg/m.  Physical Exam Physical  Exam Vitals reviewed.  Constitutional:      General: She is not in acute distress.    Appearance: She is obese.  HENT:     Head: Normocephalic and atraumatic.     Nose: Nose normal.     Mouth/Throat:     Mouth: Mucous membranes are moist.     Pharynx: Oropharynx is clear.  Eyes:     General: No scleral icterus.       Right eye: No discharge.        Left eye: No discharge.     Comments: No proptosis or exophthalmos.  No lid lag or stare.  Neck:     Comments:  The thyroid is diffusely enlarged and has a nodular texture.  No dominant masses are identified on palpation.  The gland moves freely with deglutition.  She has no palpable cervical or supraclavicular lymphadenopathy.  The trachea is midline. Cardiovascular:     Rate and Rhythm: Normal rate and regular rhythm.  Pulmonary:     Effort: Pulmonary effort is normal.     Breath sounds: Normal breath sounds.  Abdominal:     General: Bowel sounds are normal.     Palpations: Abdomen is soft.  Genitourinary:    Comments: Deferred Musculoskeletal:        General: No swelling or deformity.  Skin:    General: Skin is warm and dry.  Neurological:     General: No focal deficit present.     Mental Status: She is alert and oriented to person, place, and time.  Psychiatric:        Mood and Affect: Mood normal.        Behavior: Behavior normal.     Data Reviewed I have personally reviewed multiple imaging studies performed on this patient.  These include the emergency department evaluation on 12/11/2019 with a CT head, CT angiogram of both the head and neck.  I concur with the radiologist interpretations which are copied here:  CT head IMPRESSION: 1. No acute intracranial abnormality to explain the patient's headache. No hemorrhage noted.  CT angio head and neck IMPRESSION: 1. Negative CTA head neck.  No arterial stenosis or obstruction. 2. Negative for cerebral aneurysm or vascular malformation 3. Increased soft tissue in the  anterior mediastinum compatible with enlarged thymus. 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 Recommend CT chest with contrast for further evaluation. 4. Thyroid enlargement and bilateral thyroid nodules compatible with goiter. 2.2 cm left thyroid nodule. Thyroid ultrasound recommended. (Ref: J Am Coll Radiol. 2015 Feb;12(2): 143-50).  She also had a CT chest and ultrasound of the thyroid, both performed on December 27, 2019.  Once again, I have personally reviewed the imaging and concur with the radiologist interpretations, copied here:  CT chest IMPRESSION: 1. There is soft tissue in the anterior mediastinum measuring approximately 8.2 x 6.0 x 3.9 cm, generally in a triangular contour within the borders of the mediastinum. Favor thymic hyperplasia however thymoma is difficult to exclude. Chemical shift MRI and tissue sampling may be considered for further evaluation. 2. Redemonstrated enlarged, heterogeneous multinodular thyroid. Please see forthcoming thyroid ultrasound. Of note, clinical hyperthyroidism is sometimes associated with thymic hyperplasia. 3. Enhancing lesions of the anterior and posterior liver dome, unchanged compared to prior CT examinations dating back to 04/03/2017 and exhibiting some evidence of peripheral nodular enhancement, almost certainly benign hemangiomata. 4. Hepatic steatosis.  Ultrasound thyroid IMPRESSION: 1. Thyromegaly with findings suggestive of multinodular goiter. 2. Nodules #1 and #2 both meet imaging criteria to recommend a 1 year follow-up as indicated.  I also reviewed the notes from her emergency department visit, her visit with Dr. Rogue Bussing, and her visit with Dr. Genevive Bi. Dr. Aletha Halim note specifically also comments on the high levels of stress the patient is experiencing, associated with her job.  Dr. 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  Results for NAKEYA, ADINOLFI (MRN [id number]) as of 01/24/2020 11:16   Ref. Range 12/15/2019 10:27  TSH Latest Ref Range: 0.450 - 4.500 uIU/mL 2.780  Thyroxine (T4) Latest Ref Range: 4.5 - 12.0 ug/dL 7.4  Free Thyroxine Index Latest Ref Range: 1.2 - 4.9  2.1  T3 Uptake Ratio Latest  Ref Range: 24 - 39 % 28  These labs demonstrate that she is not hyperthyroid.  Assessment This is a 44 year old woman who was found to have thyroid nodules on studies performed for evaluation of severe right-sided head pain.  At this time, she does not have any specific compressive symptoms and she is euthyroid.  None of the nodules meet criteria for biopsy, but at least to warrant annual surveillance.  I do not think her neck and head pain are related to the multinodular goiter.  As she experiences pain with chewing, there may be a dental issue.  In addition, it sounds like her symptoms are brought on or exacerbated by being at work or being on her way to work.  This suggests that stress may be causing the pain.  She does have a primary care provider appointment later today and I have urged her to bring up these possibilities with that provider.  I also suggested that she be evaluated by dentist to determine whether or not there is a dental related cause. I do not think she requires thyroid surgery.  Plan We will have her get a thyroid ultrasound in 1 years time.  I will see her in my clinic after that visit to discuss the findings and determine whether or not any additional intervention, such as fine-needle aspiration biopsy or surgical extirpation are warranted.  In the interim, should any questions or concerns arise or if she develops compressive symptoms or other changes that would warrant more urgent follow-up, she will contact our office.  I spent greater than 50% of this 45-minute evaluation coordinating patient care and counseling her.  Fredirick Maudlin 01/24/2020, 10:05 AM

## 2020-02-03 ENCOUNTER — Ambulatory Visit: Payer: 59 | Admitting: Family Medicine

## 2020-02-29 ENCOUNTER — Other Ambulatory Visit: Payer: Self-pay

## 2020-02-29 DIAGNOSIS — J9859 Other diseases of mediastinum, not elsewhere classified: Secondary | ICD-10-CM

## 2020-03-20 ENCOUNTER — Ambulatory Visit: Admission: RE | Admit: 2020-03-20 | Payer: PRIVATE HEALTH INSURANCE | Source: Ambulatory Visit

## 2020-03-23 ENCOUNTER — Ambulatory Visit: Payer: PRIVATE HEALTH INSURANCE | Admitting: Cardiothoracic Surgery

## 2021-01-16 ENCOUNTER — Other Ambulatory Visit: Payer: Self-pay

## 2021-01-16 DIAGNOSIS — E041 Nontoxic single thyroid nodule: Secondary | ICD-10-CM

## 2021-01-30 ENCOUNTER — Ambulatory Visit: Payer: Self-pay | Attending: General Surgery

## 2021-02-07 ENCOUNTER — Ambulatory Visit: Payer: PRIVATE HEALTH INSURANCE | Admitting: General Surgery

## 2021-03-15 ENCOUNTER — Encounter: Payer: Self-pay | Admitting: General Surgery

## 2021-06-13 ENCOUNTER — Other Ambulatory Visit: Payer: Self-pay

## 2021-06-13 ENCOUNTER — Emergency Department: Payer: No Typology Code available for payment source

## 2021-06-13 ENCOUNTER — Encounter: Payer: Self-pay | Admitting: *Deleted

## 2021-06-13 DIAGNOSIS — M79672 Pain in left foot: Secondary | ICD-10-CM | POA: Diagnosis not present

## 2021-06-13 DIAGNOSIS — M791 Myalgia, unspecified site: Secondary | ICD-10-CM | POA: Diagnosis not present

## 2021-06-13 DIAGNOSIS — Y92410 Unspecified street and highway as the place of occurrence of the external cause: Secondary | ICD-10-CM | POA: Insufficient documentation

## 2021-06-13 DIAGNOSIS — M79671 Pain in right foot: Secondary | ICD-10-CM | POA: Insufficient documentation

## 2021-06-13 DIAGNOSIS — Z87891 Personal history of nicotine dependence: Secondary | ICD-10-CM | POA: Diagnosis not present
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
## 2021-06-14 ENCOUNTER — Emergency Department
Admission: EM | Admit: 2021-06-14 | Discharge: 2021-06-14 | Disposition: A | Discharge status: Home / Self Care | Payer: No Typology Code available for payment source | Attending: Emergency Medicine | Admitting: Emergency Medicine

## 2021-06-14 MED ORDER — OXYCODONE-ACETAMINOPHEN 5-325 MG PO TABS: 1.0000 | ORAL_TABLET | ORAL | 0 refills | Status: AC | PRN

## 2021-06-14 MED ORDER — OXYCODONE-ACETAMINOPHEN 5-325 MG PO TABS
1.0000 | ORAL_TABLET | Freq: Once | ORAL | Status: AC
  Administered 2021-06-14: 08:00:00 1 via ORAL
  Filled 2021-06-14: qty 1
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
## 2021-06-27 ENCOUNTER — Ambulatory Visit
Admission: RE | Admit: 2021-06-27 | Discharge: 2021-06-27 | Disposition: A | Discharge status: Home / Self Care | Payer: 59 | Source: Ambulatory Visit | Attending: Chiropractor | Admitting: Chiropractor

## 2021-06-27 ENCOUNTER — Other Ambulatory Visit: Payer: Self-pay | Admitting: Chiropractor

## 2021-06-27 ENCOUNTER — Ambulatory Visit
Admission: RE | Admit: 2021-06-27 | Discharge: 2021-06-27 | Disposition: A | Discharge status: Home / Self Care | Payer: 59 | Attending: Chiropractor | Admitting: Chiropractor

## 2021-06-27 DIAGNOSIS — M79672 Pain in left foot: Secondary | ICD-10-CM

## 2021-06-27 DIAGNOSIS — S338XXA Sprain of other parts of lumbar spine and pelvis, initial encounter: Secondary | ICD-10-CM | POA: Insufficient documentation

## 2021-06-27 DIAGNOSIS — S134XXA Sprain of ligaments of cervical spine, initial encounter: Secondary | ICD-10-CM

## 2021-06-27 DIAGNOSIS — S233XXA Sprain of ligaments of thoracic spine, initial encounter: Secondary | ICD-10-CM | POA: Insufficient documentation

## 2021-06-27 DIAGNOSIS — M79671 Pain in right foot: Secondary | ICD-10-CM

## 2021-07-04 ENCOUNTER — Telehealth: Payer: Self-pay | Admitting: *Deleted

## 2021-07-04 NOTE — Telephone Encounter (Signed)
error 

## 2021-07-10 ENCOUNTER — Ambulatory Visit
Admission: EM | Admit: 2021-07-10 | Discharge: 2021-07-10 | Disposition: A | Discharge status: Home / Self Care | Payer: Self-pay | Attending: Urgent Care | Admitting: Urgent Care

## 2021-07-10 ENCOUNTER — Other Ambulatory Visit: Payer: Self-pay

## 2021-07-10 ENCOUNTER — Encounter: Payer: Self-pay | Admitting: Emergency Medicine

## 2021-07-10 DIAGNOSIS — R109 Unspecified abdominal pain: Secondary | ICD-10-CM

## 2021-07-10 DIAGNOSIS — Z8719 Personal history of other diseases of the digestive system: Secondary | ICD-10-CM

## 2021-07-10 DIAGNOSIS — Z9889 Other specified postprocedural states: Secondary | ICD-10-CM

## 2021-07-10 DIAGNOSIS — R1032 Left lower quadrant pain: Secondary | ICD-10-CM

## 2021-07-10 DIAGNOSIS — K921 Melena: Secondary | ICD-10-CM

## 2021-07-10 MED ORDER — KETOROLAC TROMETHAMINE 60 MG/2ML IM SOLN
60.0000 mg | Freq: Once | INTRAMUSCULAR | Status: AC
  Administered 2021-07-10: 19:00:00 60 mg via INTRAMUSCULAR
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
## 2021-07-10 NOTE — Discharge Instructions (Addendum)
I am concerned that you are having an acute abdomen, recurrent abdominal emergency given your persistent and severe left-sided abdominal pain.  This is especially important to consider given your history of diverticulitis, ruptured diverticula and abdominal surgery.  As such, you will need a higher level of evaluation and care than we can provide in the urgent care setting.  Please report to the hospital now for further evaluation and intervention.
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
